# Patient Record
Sex: Male | Born: 1945 | Race: White | Hispanic: No | Marital: Married | Smoking: Former smoker
Health system: Southern US, Community
[De-identification: ages and names within clinical notes are randomized; demographics above are authoritative.]

## PROBLEM LIST (undated history)

## (undated) DIAGNOSIS — T07XXXA Unspecified multiple injuries, initial encounter: Secondary | ICD-10-CM

## (undated) DIAGNOSIS — M419 Scoliosis, unspecified: Secondary | ICD-10-CM

## (undated) HISTORY — PX: FOOT SURGERY: SHX648

---

## 2016-07-28 DIAGNOSIS — Z23 Encounter for immunization: Secondary | ICD-10-CM | POA: Diagnosis not present

## 2017-01-28 DIAGNOSIS — H524 Presbyopia: Secondary | ICD-10-CM | POA: Diagnosis not present

## 2017-07-22 DIAGNOSIS — Z23 Encounter for immunization: Secondary | ICD-10-CM | POA: Diagnosis not present

## 2018-01-16 DIAGNOSIS — J209 Acute bronchitis, unspecified: Secondary | ICD-10-CM | POA: Diagnosis not present

## 2018-01-16 DIAGNOSIS — J189 Pneumonia, unspecified organism: Secondary | ICD-10-CM | POA: Diagnosis not present

## 2018-01-16 DIAGNOSIS — R05 Cough: Secondary | ICD-10-CM | POA: Diagnosis not present

## 2018-01-16 DIAGNOSIS — R0989 Other specified symptoms and signs involving the circulatory and respiratory systems: Secondary | ICD-10-CM | POA: Diagnosis not present

## 2020-05-13 ENCOUNTER — Other Ambulatory Visit: Payer: Self-pay

## 2020-05-13 ENCOUNTER — Encounter (HOSPITAL_BASED_OUTPATIENT_CLINIC_OR_DEPARTMENT_OTHER): Payer: Self-pay | Admitting: Emergency Medicine

## 2020-05-13 ENCOUNTER — Emergency Department (HOSPITAL_BASED_OUTPATIENT_CLINIC_OR_DEPARTMENT_OTHER): Payer: Medicare Other

## 2020-05-13 ENCOUNTER — Inpatient Hospital Stay (HOSPITAL_BASED_OUTPATIENT_CLINIC_OR_DEPARTMENT_OTHER)
Admission: EM | Admit: 2020-05-13 | Discharge: 2020-05-15 | DRG: 482 | Disposition: A | Discharge status: Home / Self Care | Payer: Medicare Other | Attending: Specialist | Admitting: Specialist

## 2020-05-13 DIAGNOSIS — S72009A Fracture of unspecified part of neck of unspecified femur, initial encounter for closed fracture: Secondary | ICD-10-CM | POA: Diagnosis present

## 2020-05-13 DIAGNOSIS — S72001A Fracture of unspecified part of neck of right femur, initial encounter for closed fracture: Secondary | ICD-10-CM

## 2020-05-13 DIAGNOSIS — S72031A Displaced midcervical fracture of right femur, initial encounter for closed fracture: Secondary | ICD-10-CM | POA: Diagnosis not present

## 2020-05-13 DIAGNOSIS — W19XXXA Unspecified fall, initial encounter: Secondary | ICD-10-CM

## 2020-05-13 DIAGNOSIS — M1711 Unilateral primary osteoarthritis, right knee: Secondary | ICD-10-CM | POA: Diagnosis present

## 2020-05-13 DIAGNOSIS — W010XXA Fall on same level from slipping, tripping and stumbling without subsequent striking against object, initial encounter: Secondary | ICD-10-CM | POA: Diagnosis present

## 2020-05-13 DIAGNOSIS — Z20822 Contact with and (suspected) exposure to covid-19: Secondary | ICD-10-CM | POA: Diagnosis present

## 2020-05-13 DIAGNOSIS — Z01818 Encounter for other preprocedural examination: Secondary | ICD-10-CM

## 2020-05-13 DIAGNOSIS — Z809 Family history of malignant neoplasm, unspecified: Secondary | ICD-10-CM

## 2020-05-13 DIAGNOSIS — Z419 Encounter for procedure for purposes other than remedying health state, unspecified: Secondary | ICD-10-CM

## 2020-05-13 DIAGNOSIS — M419 Scoliosis, unspecified: Secondary | ICD-10-CM | POA: Diagnosis present

## 2020-05-13 DIAGNOSIS — Q6691 Congenital deformity of feet, unspecified, right foot: Secondary | ICD-10-CM

## 2020-05-13 DIAGNOSIS — Q6689 Other  specified congenital deformities of feet: Secondary | ICD-10-CM

## 2020-05-13 DIAGNOSIS — Y92096 Garden or yard of other non-institutional residence as the place of occurrence of the external cause: Secondary | ICD-10-CM

## 2020-05-13 DIAGNOSIS — Z87891 Personal history of nicotine dependence: Secondary | ICD-10-CM

## 2020-05-13 DIAGNOSIS — E559 Vitamin D deficiency, unspecified: Secondary | ICD-10-CM | POA: Diagnosis present

## 2020-05-13 HISTORY — DX: Unspecified multiple injuries, initial encounter: T07.XXXA

## 2020-05-13 HISTORY — DX: Scoliosis, unspecified: M41.9

## 2020-05-13 LAB — CBC WITH DIFFERENTIAL/PLATELET
Abs Immature Granulocytes: 0.04 10*3/uL (ref 0.00–0.07)
Basophils Absolute: 0 10*3/uL (ref 0.0–0.1)
Basophils Relative: 0 %
Eosinophils Absolute: 0 10*3/uL (ref 0.0–0.5)
Eosinophils Relative: 0 %
HCT: 48.1 % (ref 39.0–52.0)
Hemoglobin: 15.5 g/dL (ref 13.0–17.0)
Immature Granulocytes: 0 %
Lymphocytes Relative: 8 %
Lymphs Abs: 0.9 10*3/uL (ref 0.7–4.0)
MCH: 26.9 pg (ref 26.0–34.0)
MCHC: 32.2 g/dL (ref 30.0–36.0)
MCV: 83.4 fL (ref 80.0–100.0)
Monocytes Absolute: 1 10*3/uL (ref 0.1–1.0)
Monocytes Relative: 9 %
Neutro Abs: 9.4 10*3/uL — ABNORMAL HIGH (ref 1.7–7.7)
Neutrophils Relative %: 83 %
Platelets: 175 10*3/uL (ref 150–400)
RBC: 5.77 MIL/uL (ref 4.22–5.81)
RDW: 13.6 % (ref 11.5–15.5)
WBC: 11.5 10*3/uL — ABNORMAL HIGH (ref 4.0–10.5)
nRBC: 0 % (ref 0.0–0.2)

## 2020-05-13 MED ORDER — HYDROMORPHONE HCL 1 MG/ML IJ SOLN: 0.5000 mg | INTRAMUSCULAR | Status: DC | PRN

## 2020-05-13 MED ORDER — ONDANSETRON HCL 4 MG/2ML IJ SOLN
4.0000 mg | Freq: Once | INTRAMUSCULAR | Status: AC
  Administered 2020-05-13: 4 mg via INTRAVENOUS
  Filled 2020-05-13: qty 2

## 2020-05-13 MED ORDER — MORPHINE SULFATE (PF) 4 MG/ML IV SOLN
4.0000 mg | Freq: Once | INTRAVENOUS | Status: AC
  Administered 2020-05-13: 4 mg via INTRAVENOUS
  Filled 2020-05-13: qty 1

## 2020-05-13 NOTE — ED Triage Notes (Signed)
Pt states he was out in the yard and tripped over a root and fell  Pt is c/o right hip pain

## 2020-05-13 NOTE — ED Provider Notes (Signed)
[facility] EMERGENCY DEPARTMENT Provider Note   CSN: [id number] Arrival date & time: 05/13/20  1922     History Chief Complaint  Patient presents with  . Fall  . Hip Pain    Richard Todd is a 74 y.o. male.  HPI     This is a 74 year old male who presents with right hip pain.  Patient reports she tripped and fell on a vine in his yard.  His neighbor is an orthopedist who examined him and felt like he needed x-rays.  He states that his pain is controlled when he is not moving but he has significant pain with movement.  He was able to bear some weight but with significant pain.  Denies hitting his head or loss of consciousness.  He is not on any blood thinners.  He denies any major medical problems.  No recent Covid exposures and he is fully vaccinated.  Currently he rates his pain at 2 out of 10.  Dr. Otelia Sergeant has agreed to take care of the patient if indeed he has a fracture.  History reviewed. No pertinent past medical history.  Patient Active Problem List   Diagnosis Date Noted  . Hip fracture (HCC) 05/13/2020    Past Surgical History:  Procedure Laterality Date  . FOOT SURGERY         Family History  Problem Relation Age of Onset  . Cancer Father     Social History   Tobacco Use  . Smoking status: Former Games developer  . Smokeless tobacco: Never Used  Vaping Use  . Vaping Use: Never used  Substance Use Topics  . Alcohol use: Yes    Comment: social  . Drug use: Never    Home Medications Prior to Admission medications   Not on File    Allergies    Patient has no known allergies.  Review of Systems   Review of Systems  Constitutional: Negative for fever.  Respiratory: Negative for shortness of breath.   Cardiovascular: Negative for chest pain.  Musculoskeletal:       Right hip pain  Neurological: Negative for weakness and numbness.  All other systems reviewed and are negative.   Physical Exam Updated Vital Signs BP (!) 151/72   Pulse 74    Temp 98.3 F (36.8 C) (Oral)   Resp (!) 21   Ht 1.829 m (6')   Wt 83 kg   SpO2 97%   BMI 24.82 kg/m   Physical Exam Vitals and nursing note reviewed.  Constitutional:      Appearance: He is well-developed. He is not diaphoretic.  HENT:     Head: Normocephalic and atraumatic.     Mouth/Throat:     Mouth: Mucous membranes are moist.  Eyes:     Pupils: Pupils are equal, round, and reactive to light.  Cardiovascular:     Rate and Rhythm: Normal rate and regular rhythm.     Heart sounds: Normal heart sounds. No murmur heard.   Pulmonary:     Effort: Pulmonary effort is normal. No respiratory distress.     Breath sounds: Normal breath sounds. No wheezing.  Abdominal:     General: Bowel sounds are normal.     Palpations: Abdomen is soft.     Tenderness: There is no abdominal tenderness. There is no rebound.  Musculoskeletal:        General: Tenderness present. No deformity.     Cervical back: Neck supple.     Right lower leg: No edema.  Left lower leg: No edema.     Comments: Tenderness palpation over the right hip and greater trochanter, no obvious deformity, no foreshortening, 2+ DP pulse bilaterally  Skin:    General: Skin is warm and dry.  Neurological:     Mental Status: He is alert and oriented to person, place, and time.  Psychiatric:        Mood and Affect: Mood normal.     ED Results / Procedures / Treatments   Labs (all labs ordered are listed, but only abnormal results are displayed) Labs Reviewed  CBC WITH DIFFERENTIAL/PLATELET - Abnormal; Notable for the following components:      Result Value   WBC 11.5 (*)    Neutro Abs 9.4 (*)    All other components within normal limits  SARS CORONAVIRUS 2 BY RT PCR (HOSPITAL ORDER, PERFORMED IN [facility])  BASIC METABOLIC PANEL    EKG None  Radiology DG HIP UNILAT WITH PELVIS 2-3 VIEWS RIGHT  Result Date: 05/13/2020 CLINICAL DATA:  Fall, right hip pain EXAM: DG HIP (WITH OR WITHOUT PELVIS)  2-3V RIGHT COMPARISON:  None. FINDINGS: Single view radiograph of the pelvis and two view radiograph of the right hip demonstrates an acute transcervical femoral neck fracture with mild impaction and external rotation of the femoral head with acetabulum. The femoral head remains seated within the acetabulum. There is mild bilateral degenerative hip disease with joint space pelvis and sacrum appear intact IMPRESSION: Acute, impacted transcervical right femoral neck fracture. Femoral head is seated within the acetabulum but demonstrates mild external rotation. Electronically Signed   By: Helyn Numbers MD   On: 05/13/2020 20:18    Procedures Procedures (including critical care time)  Medications Ordered in ED Medications  HYDROmorphone (DILAUDID) injection 0.5 mg (has no administration in time range)  morphine 4 MG/ML injection 4 mg (4 mg Intravenous Given 05/13/20 2338)  ondansetron (ZOFRAN) injection 4 mg (4 mg Intravenous Given 05/13/20 2337)    ED Course  I have reviewed the triage vital signs and the nursing notes.  Pertinent labs & imaging results that were available during my care of the patient were reviewed by me and considered in my medical decision making (see chart for details).    MDM Rules/Calculators/A&P                          Patient presents with injury to the right hip.  Reports mechanical fall.  He is overall nontoxic and vital signs are reassuring.  He has tenderness over the hip without obvious deformity.  He is neurovascularly intact.  X-rays reviewed by myself and show a cervical neck fracture that is impacted.  I spoke with Dr. Otelia Sergeant on the phone who is agreed to take care of the patient and request that I place a bed request [facility].  Screening labs, EKG, Covid testing were obtained.  As needed pain medication was ordered.   Final Clinical Impression(s) / ED Diagnoses Final diagnoses:  Closed fracture of right hip, initial encounter [facility])    Rx / DC  Orders ED Discharge Orders    None       Kha Hari, Mayer Masker, MD 05/14/20 0003

## 2020-05-13 NOTE — ED Notes (Signed)
Equal bilateral pedal pulses found and marked with sharpie.

## 2020-05-14 ENCOUNTER — Encounter (HOSPITAL_BASED_OUTPATIENT_CLINIC_OR_DEPARTMENT_OTHER): Payer: Self-pay | Admitting: Specialist

## 2020-05-14 ENCOUNTER — Inpatient Hospital Stay (HOSPITAL_COMMUNITY): Payer: Medicare Other

## 2020-05-14 ENCOUNTER — Inpatient Hospital Stay (HOSPITAL_COMMUNITY): Payer: Medicare Other | Admitting: Certified Registered Nurse Anesthetist

## 2020-05-14 ENCOUNTER — Encounter (HOSPITAL_COMMUNITY)
Admission: EM | Disposition: A | Discharge status: Home / Self Care | Payer: Self-pay | Source: Home / Self Care | Attending: Specialist

## 2020-05-14 DIAGNOSIS — Z20822 Contact with and (suspected) exposure to covid-19: Secondary | ICD-10-CM | POA: Diagnosis present

## 2020-05-14 DIAGNOSIS — Z809 Family history of malignant neoplasm, unspecified: Secondary | ICD-10-CM | POA: Diagnosis not present

## 2020-05-14 DIAGNOSIS — W010XXA Fall on same level from slipping, tripping and stumbling without subsequent striking against object, initial encounter: Secondary | ICD-10-CM | POA: Diagnosis present

## 2020-05-14 DIAGNOSIS — M419 Scoliosis, unspecified: Secondary | ICD-10-CM | POA: Diagnosis present

## 2020-05-14 DIAGNOSIS — M1711 Unilateral primary osteoarthritis, right knee: Secondary | ICD-10-CM | POA: Diagnosis present

## 2020-05-14 DIAGNOSIS — Q6689 Other  specified congenital deformities of feet: Secondary | ICD-10-CM | POA: Diagnosis not present

## 2020-05-14 DIAGNOSIS — S72001A Fracture of unspecified part of neck of right femur, initial encounter for closed fracture: Secondary | ICD-10-CM | POA: Diagnosis not present

## 2020-05-14 DIAGNOSIS — E559 Vitamin D deficiency, unspecified: Secondary | ICD-10-CM | POA: Diagnosis present

## 2020-05-14 DIAGNOSIS — S72009A Fracture of unspecified part of neck of unspecified femur, initial encounter for closed fracture: Secondary | ICD-10-CM | POA: Diagnosis present

## 2020-05-14 DIAGNOSIS — S72031A Displaced midcervical fracture of right femur, initial encounter for closed fracture: Secondary | ICD-10-CM | POA: Diagnosis present

## 2020-05-14 DIAGNOSIS — Q6691 Congenital deformity of feet, unspecified, right foot: Secondary | ICD-10-CM | POA: Diagnosis not present

## 2020-05-14 DIAGNOSIS — Y92096 Garden or yard of other non-institutional residence as the place of occurrence of the external cause: Secondary | ICD-10-CM | POA: Diagnosis not present

## 2020-05-14 DIAGNOSIS — Z87891 Personal history of nicotine dependence: Secondary | ICD-10-CM | POA: Diagnosis not present

## 2020-05-14 HISTORY — PX: HIP PINNING,CANNULATED: SHX1758

## 2020-05-14 LAB — BASIC METABOLIC PANEL
Anion gap: 12 (ref 5–15)
BUN: 14 mg/dL (ref 8–23)
CO2: 24 mmol/L (ref 22–32)
Calcium: 9.1 mg/dL (ref 8.9–10.3)
Chloride: 102 mmol/L (ref 98–111)
Creatinine, Ser: 0.61 mg/dL (ref 0.61–1.24)
GFR calc Af Amer: >60 mL/min (ref >60)
GFR calc non Af Amer: >60 mL/min (ref >60)
Glucose, Bld: 105 mg/dL — ABNORMAL HIGH (ref 70–99)
Potassium: 3.7 mmol/L (ref 3.5–5.1)
Sodium: 138 mmol/L (ref 135–145)

## 2020-05-14 LAB — MRSA PCR SCREENING: MRSA by PCR: NEGATIVE

## 2020-05-14 LAB — SARS CORONAVIRUS 2 BY RT PCR (HOSPITAL ORDER, PERFORMED IN ~~LOC~~ HOSPITAL LAB): SARS Coronavirus 2: NEGATIVE

## 2020-05-14 LAB — VITAMIN D 25 HYDROXY (VIT D DEFICIENCY, FRACTURES): Vit D, 25-Hydroxy: 23.71 ng/mL — ABNORMAL LOW (ref 30–100)

## 2020-05-14 SURGERY — FIXATION, FEMUR, NECK, PERCUTANEOUS, USING SCREW
Anesthesia: General | Site: Hip | Laterality: Right

## 2020-05-14 MED ORDER — ONDANSETRON HCL 4 MG/2ML IJ SOLN
INTRAMUSCULAR | Status: AC
  Filled 2020-05-14: qty 2

## 2020-05-14 MED ORDER — ONDANSETRON HCL 4 MG/2ML IJ SOLN: 4.0000 mg | Freq: Four times a day (QID) | INTRAMUSCULAR | Status: DC | PRN

## 2020-05-14 MED ORDER — MENTHOL 3 MG MT LOZG: 1.0000 | LOZENGE | OROMUCOSAL | Status: DC | PRN

## 2020-05-14 MED ORDER — METOCLOPRAMIDE HCL 5 MG PO TABS: 5.0000 mg | ORAL_TABLET | Freq: Three times a day (TID) | ORAL | Status: DC | PRN

## 2020-05-14 MED ORDER — ASPIRIN EC 325 MG PO TBEC: 325.0000 mg | DELAYED_RELEASE_TABLET | Freq: Every day | ORAL | Status: DC

## 2020-05-14 MED ORDER — POVIDONE-IODINE 10 % EX SWAB
2.0000 "application " | Freq: Once | CUTANEOUS | Status: AC
  Administered 2020-05-14: 2 via TOPICAL

## 2020-05-14 MED ORDER — METHOCARBAMOL 1000 MG/10ML IJ SOLN
500.0000 mg | Freq: Four times a day (QID) | INTRAVENOUS | Status: DC | PRN
  Filled 2020-05-14: qty 5

## 2020-05-14 MED ORDER — POLYVINYL ALCOHOL 1.4 % OP SOLN
1.0000 [drp] | OPHTHALMIC | Status: DC | PRN
  Filled 2020-05-14: qty 15

## 2020-05-14 MED ORDER — ASPIRIN EC 325 MG PO TBEC
325.0000 mg | DELAYED_RELEASE_TABLET | Freq: Every day | ORAL | Status: DC
  Administered 2020-05-15: 325 mg via ORAL
  Filled 2020-05-14: qty 1

## 2020-05-14 MED ORDER — SODIUM CHLORIDE 0.9 % IV SOLN: INTRAVENOUS | Status: DC

## 2020-05-14 MED ORDER — BUPIVACAINE HCL (PF) 0.5 % IJ SOLN
INTRAMUSCULAR | Status: AC
  Filled 2020-05-14: qty 30

## 2020-05-14 MED ORDER — PROPOFOL 10 MG/ML IV BOLUS
INTRAVENOUS | Status: DC | PRN
  Administered 2020-05-14: 30 mg via INTRAVENOUS
  Administered 2020-05-14: 120 mg via INTRAVENOUS

## 2020-05-14 MED ORDER — METHOCARBAMOL 500 MG PO TABS
500.0000 mg | ORAL_TABLET | Freq: Four times a day (QID) | ORAL | Status: DC | PRN
  Administered 2020-05-14: 500 mg via ORAL
  Filled 2020-05-14: qty 1

## 2020-05-14 MED ORDER — BISACODYL 5 MG PO TBEC: 5.0000 mg | DELAYED_RELEASE_TABLET | Freq: Every day | ORAL | Status: DC | PRN

## 2020-05-14 MED ORDER — ASCORBIC ACID 500 MG PO TABS
500.0000 mg | ORAL_TABLET | Freq: Every day | ORAL | Status: DC
  Administered 2020-05-14 – 2020-05-15 (×2): 500 mg via ORAL
  Filled 2020-05-14 (×2): qty 1

## 2020-05-14 MED ORDER — SODIUM CHLORIDE 0.9 % IR SOLN
Status: DC | PRN
  Administered 2020-05-14: 1000 mL

## 2020-05-14 MED ORDER — ROCURONIUM BROMIDE 10 MG/ML (PF) SYRINGE
PREFILLED_SYRINGE | INTRAVENOUS | Status: AC
  Filled 2020-05-14: qty 10

## 2020-05-14 MED ORDER — ROCURONIUM BROMIDE 10 MG/ML (PF) SYRINGE
PREFILLED_SYRINGE | INTRAVENOUS | Status: DC | PRN
  Administered 2020-05-14: 5 mg via INTRAVENOUS
  Administered 2020-05-14: 80 mg via INTRAVENOUS

## 2020-05-14 MED ORDER — ACETAMINOPHEN 325 MG PO TABS: 325.0000 mg | ORAL_TABLET | Freq: Four times a day (QID) | ORAL | Status: DC | PRN

## 2020-05-14 MED ORDER — FENTANYL CITRATE (PF) 100 MCG/2ML IJ SOLN
INTRAMUSCULAR | Status: AC
  Filled 2020-05-14: qty 2

## 2020-05-14 MED ORDER — DEXAMETHASONE SODIUM PHOSPHATE 10 MG/ML IJ SOLN
INTRAMUSCULAR | Status: DC | PRN
  Administered 2020-05-14: 10 mg via INTRAVENOUS

## 2020-05-14 MED ORDER — HYDROCODONE-ACETAMINOPHEN 5-325 MG PO TABS: 1.0000 | ORAL_TABLET | Freq: Four times a day (QID) | ORAL | Status: DC | PRN

## 2020-05-14 MED ORDER — DEXAMETHASONE SODIUM PHOSPHATE 10 MG/ML IJ SOLN
INTRAMUSCULAR | Status: AC
  Filled 2020-05-14: qty 1

## 2020-05-14 MED ORDER — MAGNESIUM CITRATE PO SOLN: 1.0000 | Freq: Once | ORAL | Status: DC | PRN

## 2020-05-14 MED ORDER — EPHEDRINE SULFATE-NACL 50-0.9 MG/10ML-% IV SOSY
PREFILLED_SYRINGE | INTRAVENOUS | Status: DC | PRN
  Administered 2020-05-14: 5 mg via INTRAVENOUS

## 2020-05-14 MED ORDER — LIDOCAINE 2% (20 MG/ML) 5 ML SYRINGE
INTRAMUSCULAR | Status: DC | PRN
  Administered 2020-05-14: 100 mg via INTRAVENOUS

## 2020-05-14 MED ORDER — CHLORHEXIDINE GLUCONATE 4 % EX LIQD: 60.0000 mL | Freq: Once | CUTANEOUS | Status: DC

## 2020-05-14 MED ORDER — PHENYLEPHRINE HCL-NACL 10-0.9 MG/250ML-% IV SOLN
INTRAVENOUS | Status: DC | PRN
Start: 2020-05-14 — End: 2020-05-14
  Administered 2020-05-14: 25 ug/min via INTRAVENOUS

## 2020-05-14 MED ORDER — TRAMADOL HCL 50 MG PO TABS
50.0000 mg | ORAL_TABLET | Freq: Four times a day (QID) | ORAL | Status: DC
  Administered 2020-05-15: 50 mg via ORAL
  Filled 2020-05-14: qty 1

## 2020-05-14 MED ORDER — PROPOFOL 10 MG/ML IV BOLUS
INTRAVENOUS | Status: AC
  Filled 2020-05-14: qty 20

## 2020-05-14 MED ORDER — DOCUSATE SODIUM 100 MG PO CAPS
100.0000 mg | ORAL_CAPSULE | Freq: Two times a day (BID) | ORAL | Status: DC
  Administered 2020-05-14 – 2020-05-15 (×2): 100 mg via ORAL
  Filled 2020-05-14 (×2): qty 1

## 2020-05-14 MED ORDER — BUPIVACAINE LIPOSOME 1.3 % IJ SUSP
10.0000 mL | Freq: Once | INTRAMUSCULAR | Status: AC
  Administered 2020-05-14: 4 mL
  Filled 2020-05-14: qty 10

## 2020-05-14 MED ORDER — METOCLOPRAMIDE HCL 5 MG/ML IJ SOLN: 5.0000 mg | Freq: Three times a day (TID) | INTRAMUSCULAR | Status: DC | PRN

## 2020-05-14 MED ORDER — DOCUSATE SODIUM 100 MG PO CAPS: 100.0000 mg | ORAL_CAPSULE | Freq: Two times a day (BID) | ORAL | Status: DC

## 2020-05-14 MED ORDER — BUPIVACAINE HCL (PF) 0.5 % IJ SOLN
INTRAMUSCULAR | Status: DC | PRN
  Administered 2020-05-14: 4 mL

## 2020-05-14 MED ORDER — MORPHINE SULFATE (PF) 2 MG/ML IV SOLN: 0.5000 mg | INTRAVENOUS | Status: DC | PRN

## 2020-05-14 MED ORDER — ONDANSETRON HCL 4 MG/2ML IJ SOLN: 4.0000 mg | Freq: Once | INTRAMUSCULAR | Status: DC | PRN

## 2020-05-14 MED ORDER — FENTANYL CITRATE (PF) 100 MCG/2ML IJ SOLN
INTRAMUSCULAR | Status: DC | PRN
  Administered 2020-05-14 (×3): 50 ug via INTRAVENOUS
  Administered 2020-05-14: 100 ug via INTRAVENOUS

## 2020-05-14 MED ORDER — MEPERIDINE HCL 50 MG/ML IJ SOLN: 6.2500 mg | INTRAMUSCULAR | Status: DC | PRN

## 2020-05-14 MED ORDER — METHOCARBAMOL 500 MG PO TABS: 500.0000 mg | ORAL_TABLET | Freq: Four times a day (QID) | ORAL | Status: DC | PRN

## 2020-05-14 MED ORDER — HYDROCODONE-ACETAMINOPHEN 5-325 MG PO TABS
1.0000 | ORAL_TABLET | ORAL | Status: DC | PRN
  Administered 2020-05-14: 2 via ORAL
  Filled 2020-05-14: qty 2

## 2020-05-14 MED ORDER — POLYETHYLENE GLYCOL 3350 17 G PO PACK: 17.0000 g | PACK | Freq: Every day | ORAL | Status: DC | PRN

## 2020-05-14 MED ORDER — HYDROCODONE-ACETAMINOPHEN 7.5-325 MG PO TABS
1.0000 | ORAL_TABLET | ORAL | Status: DC | PRN
  Administered 2020-05-14 – 2020-05-15 (×2): 1 via ORAL
  Filled 2020-05-14 (×2): qty 1

## 2020-05-14 MED ORDER — ZOLPIDEM TARTRATE 5 MG PO TABS: 5.0000 mg | ORAL_TABLET | Freq: Every evening | ORAL | Status: DC | PRN

## 2020-05-14 MED ORDER — SUGAMMADEX SODIUM 200 MG/2ML IV SOLN
INTRAVENOUS | Status: DC | PRN
  Administered 2020-05-14: 200 mg via INTRAVENOUS

## 2020-05-14 MED ORDER — LACTATED RINGERS IV SOLN: INTRAVENOUS | Status: DC | PRN

## 2020-05-14 MED ORDER — HYDROMORPHONE HCL 1 MG/ML IJ SOLN: 0.2500 mg | INTRAMUSCULAR | Status: DC | PRN

## 2020-05-14 MED ORDER — CEFAZOLIN SODIUM-DEXTROSE 2-4 GM/100ML-% IV SOLN
2.0000 g | INTRAVENOUS | Status: AC
  Administered 2020-05-14: 2 g via INTRAVENOUS
  Filled 2020-05-14: qty 100

## 2020-05-14 MED ORDER — LIDOCAINE 2% (20 MG/ML) 5 ML SYRINGE
INTRAMUSCULAR | Status: AC
  Filled 2020-05-14: qty 5

## 2020-05-14 MED ORDER — ADULT MULTIVITAMIN W/MINERALS CH
1.0000 | ORAL_TABLET | Freq: Every day | ORAL | Status: DC
  Administered 2020-05-14 – 2020-05-15 (×2): 1 via ORAL
  Filled 2020-05-14 (×2): qty 1

## 2020-05-14 MED ORDER — PHENOL 1.4 % MT LIQD: 1.0000 | OROMUCOSAL | Status: DC | PRN

## 2020-05-14 MED ORDER — ENSURE PRE-SURGERY PO LIQD
296.0000 mL | Freq: Once | ORAL | Status: AC
  Administered 2020-05-14: 296 mL via ORAL
  Filled 2020-05-14: qty 296

## 2020-05-14 MED ORDER — ONDANSETRON HCL 4 MG/2ML IJ SOLN
INTRAMUSCULAR | Status: DC | PRN
  Administered 2020-05-14: 4 mg via INTRAVENOUS

## 2020-05-14 MED ORDER — ONDANSETRON HCL 4 MG PO TABS: 4.0000 mg | ORAL_TABLET | Freq: Four times a day (QID) | ORAL | Status: DC | PRN

## 2020-05-14 SURGICAL SUPPLY — 42 items
BAG DECANTER FOR FLEXI CONT (MISCELLANEOUS) ×2 IMPLANT
BAG ZIPLOCK 12X15 (MISCELLANEOUS) ×2 IMPLANT
BIT DRILL 4.8X300 (BIT) ×2 IMPLANT
BNDG GAUZE ELAST 4 BULKY (GAUZE/BANDAGES/DRESSINGS) ×2 IMPLANT
COVER SURGICAL LIGHT HANDLE (MISCELLANEOUS) ×2 IMPLANT
COVER WAND RF STERILE (DRAPES) IMPLANT
DERMABOND ADVANCED (GAUZE/BANDAGES/DRESSINGS) ×1
DERMABOND ADVANCED .7 DNX12 (GAUZE/BANDAGES/DRESSINGS) ×1 IMPLANT
DRAPE STERI IOBAN 125X83 (DRAPES) ×2 IMPLANT
DRSG MEPILEX BORDER 4X8 (GAUZE/BANDAGES/DRESSINGS) ×2 IMPLANT
DRSG PAD ABDOMINAL 8X10 ST (GAUZE/BANDAGES/DRESSINGS) ×2 IMPLANT
DURAPREP 26ML APPLICATOR (WOUND CARE) ×2 IMPLANT
ELECT REM PT RETURN 15FT ADLT (MISCELLANEOUS) ×2 IMPLANT
EVACUATOR 1/8 PVC DRAIN (DRAIN) IMPLANT
EVACUATOR SILICONE 100CC (DRAIN) IMPLANT
GAUZE SPONGE 4X4 12PLY STRL (GAUZE/BANDAGES/DRESSINGS) ×2 IMPLANT
GLOVE ECLIPSE 8.5 STRL (GLOVE) ×4 IMPLANT
GOWN STRL REUS W/TWL XL LVL3 (GOWN DISPOSABLE) ×2 IMPLANT
KIT BASIN OR (CUSTOM PROCEDURE TRAY) ×2 IMPLANT
KIT TURNOVER KIT A (KITS) IMPLANT
MANIFOLD NEPTUNE II (INSTRUMENTS) ×2 IMPLANT
NEEDLE HYPO 25X1 1.5 SAFETY (NEEDLE) ×2 IMPLANT
NS IRRIG 1000ML POUR BTL (IV SOLUTION) ×2 IMPLANT
PACK GENERAL/GYN (CUSTOM PROCEDURE TRAY) ×2 IMPLANT
PAD CAST 4YDX4 CTTN HI CHSV (CAST SUPPLIES) ×1 IMPLANT
PADDING CAST COTTON 4X4 STRL (CAST SUPPLIES) ×1
PENCIL SMOKE EVACUATOR (MISCELLANEOUS) IMPLANT
PIN GUIDE DRILL TIP 2.8X300 (DRILL) ×8 IMPLANT
PROTECTOR NERVE ULNAR (MISCELLANEOUS) ×2 IMPLANT
SCREW 16MM THREAD 6.5X85MM (Screw) ×2 IMPLANT
SCREW CANN 6.5X90 (Screw) ×1 IMPLANT
SCREW CANN 6.5X90 16MM THD (Screw) ×1 IMPLANT
SCREW CANNULATED 6.5X90MM (Screw) ×1 IMPLANT
SCREW CANNULATED 6.5X95 HIP (Screw) ×6 IMPLANT
STAPLER VISISTAT 35W (STAPLE) ×2 IMPLANT
SUT VIC AB 0 CT1 27 (SUTURE) ×2
SUT VIC AB 0 CT1 27XBRD ANTBC (SUTURE) ×2 IMPLANT
SUT VIC AB 2-0 CT1 27 (SUTURE) ×1
SUT VIC AB 2-0 CT1 27XBRD (SUTURE) ×1 IMPLANT
SYR CONTROL 10ML LL (SYRINGE) IMPLANT
TOWEL OR 17X26 10 PK STRL BLUE (TOWEL DISPOSABLE) ×4 IMPLANT
WATER STERILE IRR 1000ML POUR (IV SOLUTION) ×2 IMPLANT

## 2020-05-14 NOTE — Progress Notes (Signed)
Surgical consent signed by pt. Pt denied any questions or concerns.

## 2020-05-14 NOTE — Op Note (Addendum)
05/14/2020  3:48 PM  PATIENT:  Richard Todd  74 y.o. male  MRN: [id number]  OPERATIVE REPORT  PRE-OPERATIVE DIAGNOSIS:  Right Closed Impacted Femoral neck fracture  POST-OPERATIVE DIAGNOSIS:  Right Closed Impacted Femoral neck fracture  PROCEDURE:  Procedure(s): CLOSED REDUCTION AND CANNULATED PINNING RIGHT VALGUS IMPACTED FEMORAL NECK FRACTURE.    SURGEON:  Jessy Oto, MD        ANESTHESIA:  General, supplemented with local anesthetic marcaine 0.5% 1:1  exparel 1.3% total 20cc. Dr. Lillia Abed.    COMPLICATIONS:  None.  EBL 100 CC     COMPONENTS:   Implant Name Type Inv. Item Serial No. Manufacturer Lot No. LRB No. Used Action  SCREW 16MM THREAD 6.5X85MM - [id number] Screw SCREW 16MM THREAD 6.5X85MM  [employer](ORTH,TRAU,BIO,SG)  Right 1 Implanted  SCREW CANNULATED 6.5X90MM - [id number] Screw SCREW CANNULATED 6.5X90MM  [employer](ORTH,TRAU,BIO,SG)  Right 1 Implanted  SCREW CANNULATED 6.5X95 HIP - [id number] Screw SCREW CANNULATED 6.5X95 HIP  [employer](ORTH,TRAU,BIO,SG)  Right 1 Implanted and Explanted  SCREW CANNULATED 6.5X95 HIP - [id number] Screw SCREW CANNULATED 6.5X95 HIP  [employer](ORTH,TRAU,BIO,SG)  Right 2 Implanted    PROCEDURE:  The patient was met in the holding area, and the appropriate right hip identified and marked with an "X" and my initials.  The patient was then transported to OR and was placed on the operative Hanna fracture table in a supine position. The patient was then placed under general anesthesia without difficulty. The patient received appropriate preoperative antibiotic prophylaxis of Ancef 2 g. Right  leg was placed in a well leg holder. Left leg placed in a left foot boot, but no significant longitudinal traction was applied and the foot in approximately 15 of internal rotation. left groin post was used. Evaluation of the femoral neck fracture demonstrated an impacted valgus transcervical hip fracture. The fracture was not able to be  reducted to normal varus valgus alignment with hip adduction and internal rotation so that the right foot boot was removed from the holder and the right hip placed through a gentle Leadbetter maneuver flexing the hip then internally rotating and returning the hip to neutral flexion extension. Repeat fluoro demonstrated the right femoral neck fracture reduced to normal length with reduction of the valgus position to neutral varus valgus alignment. The right foot and leg holding boot replaced into the longitudinal holder, no distraction or traction was applied to the right leg. The right hip in 15 degrees internal rotation and neutral abduction adduction. The leg was then prepped using sterile conditions and draped using sterile technique. An iodine exclusion Vi-Drape was used. Time-out procedure was called and correct .  C-arm fluoroscopy was then brought into the field. Under C-arm fluoroscopy left hip was examined and the site for incision marked with Kelly clamp. Incision was made along the lateral aspect of the femur in line with the proximal femur just distal to the greater trochanter.  Incision approximately 3 inches in length. Subcutaneous layers then incised to the ITB. Incision was then carried in line with the iliotibial band this layers spread with Weitlander. The vastus lateralis was then incised after retraction anteriorly and blunt dissection used to expose the lateral aspect of the femur at the gradual curve from the distal portion of the trochanter laterally. Multiple guidepins guide was then inserted and the first guide pin placed into the mid portion of the femoral neck and head on AP and lateral views. The first guide pin was placed down to just short  of subchondral bone about 2 or 3 mm on C-arm fluoroscopy in AP lateral planes in good position alignment area using this as a reference then 3 more pins were placed creating an inverted triangle fixation the pins were placed as close to the cortex up  the femoral neck is possible to obtain better purchase. Total of 4 guide pins were placed one superior and in the midportion head on the AP and lateral views respectively. The second placed in the inferior and posterior aspect of the neck was placed in the inferior and anterior aspect of the neck. Each of these were then measured for length the superficial cortex and overdrilled was taken to ensure that each of the guidepins were placed short of subchondral bone and there was no penetration of the joint. The knife then the superior most aspect measured at about 95-100 mm a 95 mm cannulated screw was chosen was then placed over the guidepin and using power placed within a few turns of the lateral cortex then a T-handle was used to seat the screw obtaining excellent purchase.  C-arm fluoroscopy used to check the alignment and position screws appear to be well placed by penetration area next then the second screw was placed in the posterior inferior position measuring length using a 95 mm length screw over drilling the lateral cortex and then placing the screw within 3-4 mm of subchondral bone. The shorter screw length were used ensuring no penetration finding the anterior inferior screw was placed again measuring depth and 95 mm and over drilling the lateral cortex and plantar the cannulated screw within several turned to the lateral cortex and placing in and obtaining excellent purchase on all 3 screws. After tightening the cannulated screws the posterior most screw was close to the subchondral bone and was near the articular surface so that this was exchanged for a cannulated screws that was 5 mm shorter. At this point a fourth screw was placed as the superior portion of the neck and head was free of fixation and with the shortening of the one screw a more stable construct was felt to be present with the additional fourth pin, the four cannulated screw was 6.5 x 85 mm and provided excellent purchase. The guidepins  were removed and permanent documentation in AP and lateral planes obtained and using C-arm fluoroscopy care for rotation of the femur demonstrated all screws to be in good position alignment without evidence of penetration. The soft tissues and subcutaneous areas then infiltrated with marcaine 0.5% 1:1 exparel 1.3% total 20cc.  Following further irrigation and the 3 1/2 inch incision was closed first closing the vastus lateralis with a single interrupted 0 Vicryl suture then the tensor fascia lata with interrupted 0 Vicryl sutures subcutaneous layers with interrupted       2-0 Vicryl and a running subcutaneous stitch of 4-0 Vicryl. Dermabond was applied then MedPlex bandage. Patient was then returned to his bed reactivated extubated and returned to recovery room in satisfactory condition all instrument sponge counts were correct. Note that intraoperative C-arm images were obtained their quality was not sufficient for documentation so that permanent radiographs were obtained in the recovery room         Basil Dess  05/14/2020, 3:48 PM

## 2020-05-14 NOTE — Brief Op Note (Signed)
05/14/2020  3:34 PM  PATIENT:  Richard Todd  74 y.o. male  PRE-OPERATIVE DIAGNOSIS:  Femeoral head fracture  POST-OPERATIVE DIAGNOSIS:  Femeoral head fracture  PROCEDURE:  Procedure(s): CANNULATED HIP PINNING (Right)  SURGEON:  Surgeon(s) and Role:    * Kerrin Champagne, MD - Primary   ANESTHESIA:   general  EBL:  50 mL   BLOOD ADMINISTERED:none  DRAINS: none   LOCAL MEDICATIONS USED:  MARCAINE 0.5% 1:1 EXPAREL 1.3% Amount: 20 ml  SPECIMEN:  No Specimen  DISPOSITION OF SPECIMEN:  N/A  COUNTS:  YES  TOURNIQUET:  * No tourniquets in log *  DICTATION: .Dragon Dictation  PLAN OF CARE: Admit to inpatient   PATIENT DISPOSITION:  PACU - hemodynamically stable.   Delay start of Pharmacological VTE agent (>24hrs) due to surgical blood loss or risk of bleeding: no

## 2020-05-14 NOTE — Assessment & Plan Note (Signed)
Closed impacted valgus right femoral neck fracture. Admit to [facility] for surgical treatment

## 2020-05-14 NOTE — Interval H&P Note (Signed)
History and Physical Interval Note:  05/14/2020 1:11 PM  Richard Todd  has presented today for surgery, with the diagnosis of Femeoral head fracture.  The various methods of treatment have been discussed with the patient and family. After consideration of risks, benefits and other options for treatment, the patient has consented to  Procedure(s): CANNULATED HIP PINNING (Right) as a surgical intervention.  The patient's history has been reviewed, patient examined, no change in status, stable for surgery.  I have reviewed the patient's chart and labs.  Questions were answered to the patient's satisfaction.     Vira Browns

## 2020-05-14 NOTE — Anesthesia Postprocedure Evaluation (Signed)
Anesthesia Post Note  Patient: Brendt Dible  Procedure(s) Performed: CANNULATED HIP PINNING (Right Hip)     Patient location during evaluation: PACU Anesthesia Type: General Level of consciousness: awake and alert Pain management: pain level controlled Vital Signs Assessment: post-procedure vital signs reviewed and stable Respiratory status: spontaneous breathing, nonlabored ventilation, respiratory function stable and patient connected to nasal cannula oxygen Cardiovascular status: blood pressure returned to baseline and stable Postop Assessment: no apparent nausea or vomiting Anesthetic complications: no   No complications documented.  Last Vitals:  Vitals:   05/14/20 1600 05/14/20 1612  BP: 136/69 137/80  Pulse: 65 (!) 59  Resp: 15 18  Temp: 37.2 C 36.8 C  SpO2: 100% 99%    Last Pain:  Vitals:   05/14/20 1612  TempSrc: Oral  PainSc:                  Damire Remedios DAVID

## 2020-05-14 NOTE — Anesthesia Preprocedure Evaluation (Signed)
Anesthesia Evaluation  Patient identified by MRN, date of birth, ID band Patient awake    Reviewed: Allergy & Precautions, NPO status , Patient's Chart, lab work & pertinent test results  Airway Mallampati: I  TM Distance: >3 FB Neck ROM: Full    Dental   Pulmonary former smoker,    Pulmonary exam normal        Cardiovascular Normal cardiovascular exam     Neuro/Psych    GI/Hepatic   Endo/Other    Renal/GU      Musculoskeletal   Abdominal   Peds  Hematology   Anesthesia Other Findings   Reproductive/Obstetrics                             Anesthesia Physical Anesthesia Plan  ASA: II  Anesthesia Plan: General   Post-op Pain Management:    Induction: Intravenous  PONV Risk Score and Plan: Ondansetron and Treatment may vary due to age or medical condition  Airway Management Planned: Oral ETT  Additional Equipment:   Intra-op Plan:   Post-operative Plan: Extubation in OR  Informed Consent: I have reviewed the patients History and Physical, chart, labs and discussed the procedure including the risks, benefits and alternatives for the proposed anesthesia with the patient or authorized representative who has indicated his/her understanding and acceptance.       Plan Discussed with: CRNA and Surgeon  Anesthesia Plan Comments:         Anesthesia Quick Evaluation

## 2020-05-14 NOTE — H&P (Addendum)
PREOPERATIVE H&P  Chief Complaint: Right hip pain following fall.  HPI: Richard Todd is a 74 y.o. male who presents via transfer from [facility] with a diagnosis of right impacted valgus femoral neck fracture. Symptoms are rated as moderate to severe, and have been worsening.  This is significantly impairing activities of daily living.  He has elected for surgical management. Patient seen at [facility] after fall [facility] 05/13/2020 while performing yard work, he tripped on stump in his yard landing on the right hip. Pain and inability to bear weight on the right leg since the fall. Unable to flex the right hip actively. Pain is right buttock into the deep right lateral hip, severe with any range of motion of with weight bearing right hip. 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 History of a remote greater than 20 years ago Rib fracture, right knee degenerative joint disease, scoliosis.  Past Medical History:  Diagnosis Date  . Fractures   . Scoliosis    Past Surgical History:  Procedure Laterality Date  . FOOT SURGERY     Social History   Socioeconomic History  . Marital status: Married    Spouse name: Windell Moulding  . Number of children: 0  . Years of education: 68  . Highest education level: Associate degree: occupational, Scientist, product/process development, or vocational program  Occupational History  . 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 Smoking status: Former Smoker    Packs/day: 1.00    Years: 10.00    Pack years: 10.00    Quit date: 05/14/1977    Years since quitting: 43.0  . Smokeless tobacco: Never Used  Vaping Use  . Vaping Use: Never used  Substance and Sexual Activity  . Alcohol use: Yes    Comment: social  . Drug use: Never  . Sexual activity: Not on file  Other Topics Concern  . Not on file  Social History Narrative  . Not on file   Social Determinants of Health    Financial Resource Strain:   . Difficulty of Paying Living Expenses:   Food Insecurity:   . Worried About Programme researcher, broadcasting/film/video in the Last Year:   . Barista in the Last Year:   Transportation Needs:   . Freight forwarder (Medical):   Marland Kitchen Lack of Transportation (Non-Medical):   Physical Activity:   . Days of Exercise per Week:   . Minutes of Exercise per Session:   Stress:   . Feeling of Stress :   Social Connections:   . Frequency of Communication with Friends and Family:   . Frequency of Social Gatherings with Friends and Family:   . Attends Religious Services:   . Active Member of Clubs or Organizations:   . Attends Banker Meetings:   Marland Kitchen Marital Status:    Family History  Problem Relation Age of Onset  . Cancer Father    No Known Allergies Prior to Admission medications   Not on File     Positive ROS: All other systems have been reviewed and were otherwise negative with the exception of those mentioned in the HPI and as above.  Physical Exam: General: Alert, no acute distress Cardiovascular: No pedal edema Respiratory: No cyanosis, no use of accessory musculature GI: No organomegaly, abdomen is soft and non-tender Skin: No lesions in the area of chief complaint Neurologic: Sensation intact  distally Psychiatric: Patient is competent for consent with normal mood and affect Lymphatic: No axillary or cervical lymphadenopathy  MUSCULOSKELETAL: Right leg is minimally externally rotated, No shortening of the right leg. Right leg is neurovascularly intact. Motor is intact but attended by severe pain with weakness right hip flexion and with any ROM.  Panful ROM right hip with slightest movement.  CLINICAL DATA:  Fall, right hip pain  EXAM: DG HIP (WITH OR WITHOUT PELVIS) 2-3V RIGHT  COMPARISON:  None.  FINDINGS: Single view radiograph of the pelvis and two view radiograph of the right hip demonstrates an acute transcervical femoral neck  fracture with mild impaction and external rotation of the femoral head with acetabulum. The femoral head remains seated within the acetabulum. There is mild bilateral degenerative hip disease with joint space pelvis and sacrum appear intact  IMPRESSION: Acute, impacted transcervical right femoral neck fracture. Femoral head is seated within the acetabulum but demonstrates mild external rotation.   Electronically Signed   By: Helyn Numbers MD   On: 05/13/2020 20:18   Assessment: Closed right impacted femoral neck fracture, valgas. Right foot congenital deformity  Plan: Plan for admit to [facility], surgical treatment with closed manipulation and internal fixation of the right closed impacted femoral neck fracture with cannulated screws. Bed rest non weight bearing right leg.   The risks benefits and alternatives were discussed with the patient including but not limited to the risks of nonoperative treatment, versus surgical intervention including infection, bleeding, nerve injury,  blood clots, cardiopulmonary complications, morbidity, mortality, among others, and they were willing to proceed.   Vira Browns, MD Cell [phone] Office [phone] 05/14/2020 12:38 AM

## 2020-05-14 NOTE — ED Notes (Signed)
[facility] notifed (Tammy) - patient ready for transport

## 2020-05-14 NOTE — Transfer of Care (Signed)
Immediate Anesthesia Transfer of Care Note  Patient: Richard Todd  Procedure(s) Performed: CANNULATED HIP PINNING (Right Hip)  Patient Location: PACU  Anesthesia Type:General  Level of Consciousness: awake, drowsy, patient cooperative and responds to stimulation  Airway & Oxygen Therapy: Patient Spontanous Breathing and Patient connected to face mask oxygen  Post-op Assessment: Report given to RN and Post -op Vital signs reviewed and stable  Post vital signs: Reviewed and stable  Last Vitals:  Vitals Value Taken Time  BP 121/70   Temp    Pulse 70 05/14/20 1519  Resp 16 05/14/20 1519  SpO2 100 % 05/14/20 1519  Vitals shown include unvalidated device data.  Last Pain:  Vitals:   05/14/20 1230  TempSrc: Oral  PainSc:       Patients Stated Pain Goal: 2 (05/14/20 0222)  Complications: No complications documented.

## 2020-05-14 NOTE — Anesthesia Procedure Notes (Signed)
Procedure Name: Intubation Date/Time: 05/14/2020 1:26 PM Performed by: Maxwell Caul, CRNA Pre-anesthesia Checklist: Patient identified, Emergency Drugs available, Suction available and Patient being monitored Patient Re-evaluated:Patient Re-evaluated prior to induction Oxygen Delivery Method: Circle system utilized Preoxygenation: Pre-oxygenation with 100% oxygen Induction Type: IV induction Ventilation: Mask ventilation without difficulty Laryngoscope Size: Mac and 4 Grade View: Grade I Tube type: Oral Tube size: 7.5 mm Number of attempts: 1 Airway Equipment and Method: Stylet Placement Confirmation: ETT inserted through vocal cords under direct vision,  positive ETCO2 and breath sounds checked- equal and bilateral Secured at: 22 cm Tube secured with: Tape Dental Injury: Teeth and Oropharynx as per pre-operative assessment

## 2020-05-15 ENCOUNTER — Encounter (HOSPITAL_COMMUNITY): Payer: Self-pay | Admitting: Specialist

## 2020-05-15 ENCOUNTER — Other Ambulatory Visit: Payer: Self-pay | Admitting: Specialist

## 2020-05-15 DIAGNOSIS — E559 Vitamin D deficiency, unspecified: Secondary | ICD-10-CM | POA: Diagnosis present

## 2020-05-15 LAB — CBC
HCT: 42.4 % (ref 39.0–52.0)
Hemoglobin: 13.6 g/dL (ref 13.0–17.0)
MCH: 27.5 pg (ref 26.0–34.0)
MCHC: 32.1 g/dL (ref 30.0–36.0)
MCV: 85.8 fL (ref 80.0–100.0)
Platelets: 135 10*3/uL — ABNORMAL LOW (ref 150–400)
RBC: 4.94 MIL/uL (ref 4.22–5.81)
RDW: 13.6 % (ref 11.5–15.5)
WBC: 8.4 10*3/uL (ref 4.0–10.5)
nRBC: 0 % (ref 0.0–0.2)

## 2020-05-15 MED ORDER — METHOCARBAMOL 500 MG PO TABS: 500.0000 mg | ORAL_TABLET | Freq: Three times a day (TID) | ORAL | 1 refills | Status: AC | PRN

## 2020-05-15 MED ORDER — HYDROCODONE-ACETAMINOPHEN 5-325 MG PO TABS: 1.0000 | ORAL_TABLET | Freq: Four times a day (QID) | ORAL | 0 refills | Status: AC | PRN

## 2020-05-15 MED ORDER — DOCUSATE SODIUM 100 MG PO CAPS: 100.0000 mg | ORAL_CAPSULE | Freq: Two times a day (BID) | ORAL | 0 refills | Status: AC

## 2020-05-15 MED ORDER — VITAMIN D (ERGOCALCIFEROL) 1.25 MG (50000 UNIT) PO CAPS: 50000.0000 [IU] | ORAL_CAPSULE | ORAL | 1 refills | Status: AC

## 2020-05-15 NOTE — Discharge Instructions (Signed)
    Keep dressing dry. May use tub chair to shower  Call if there is odor or saturation of dressing or worsening pain not controlled with medications. Call if fever greater than 101.5. Use crutches or walker touchdown weight bearing on the right leg. Please follow up with an appointment with Dr. Otelia Sergeant  2 weeks from the time of surgery [phone]. Elevate as often as possible during the first week after surgery gradually increasing the time the leg is dependent or down there after. If swelling recurrs then elevate again. Wheel chair for longer distances.Apply ice to the surgery site as needed to relieve pain. Take aspirin 325 mg every day with food or snack

## 2020-05-15 NOTE — Progress Notes (Signed)
     Subjective: 1 Day Post-Op Procedure(s) (LRB): CANNULATED HIP PINNING (Right) Milld discomfort, moving right leg better less right hip pain.  Patient reports pain as mild.    Objective:   VITALS:  Temp:  [97.5 F (36.4 C)-99.3 F (37.4 C)] 98.3 F (36.8 C) (08/11 0943) Pulse Rate:  [55-77] 67 (08/11 0943) Resp:  [15-18] 18 (08/11 0943) BP: (117-138)/(63-87) 133/76 (08/11 0943) SpO2:  [96 %-100 %] 98 % (08/11 0943)  Neurologically intact ABD soft Neurovascular intact Sensation intact distally Intact pulses distally Dorsiflexion/Plantar flexion intact Incision: dressing C/D/I and no drainage   LABS Recent Labs    05/13/20 2339 05/15/20 0247  HGB 15.5 13.6  WBC 11.5* 8.4  PLT 175 135*   Recent Labs    05/13/20 2339  NA 138  K 3.7  CL 102  CO2 24  BUN 14  CREATININE 0.61  GLUCOSE 105*   No results for input(s): LABPT, INR in the last 72 hours.   Assessment/Plan: 1 Day Post-Op Procedure(s) (LRB): CANNULATED HIP PINNING (Right)  Advance diet Up with therapy D/C IV fluids  Home health to be set up. If he does well with PT then consider discharge home today.   Vira Browns 05/15/2020, 10:06 AMPatient ID: Richard Todd, male   DOB: [birth year], 74 y.o.   MRN: [id number]

## 2020-05-15 NOTE — Progress Notes (Signed)
Physical Therapy Treatment Patient Details Name: Richard Todd MRN: [id number] DOB: 22-Jun-1946 Today's Date: 05/15/2020    History of Present Illness Pt is a 74 year old male s/p R hip pinning due to fracture.  PMHx significant for scoliosis and right foot surgery and multiple casting as a child    PT Comments    Discussed differences in crutches vs RW for stability and safety.  Pt agreeable to use RW and performed gait training and stair negotiation with RW.  Pt better able to maintain TDWB with RW as well.  Pt aware to perform gentle exercises (no SLR) and activity in moderation.  Pt eager to d/c home today.  Spouse present and observed session.   Follow Up Recommendations  Follow surgeon's recommendation for DC plan and follow-up therapies     Equipment Recommendations  3in1 (PT);Rolling walker with 5" wheels    Recommendations for Other Services       Precautions / Restrictions Precautions Precautions: Fall Restrictions Weight Bearing Restrictions: Yes RLE Weight Bearing: Touchdown weight bearing    Mobility  Bed Mobility Overal bed mobility: Needs Assistance Bed Mobility: Supine to Sit     Supine to sit: Supervision     General bed mobility comments: pt in recliner  Transfers Overall transfer level: Needs assistance Equipment used: Rolling walker (2 wheeled) Transfers: Sit to/from Stand Sit to Stand: Min guard         General transfer comment: min/guard for safety, cues for safe use of RW  Ambulation/Gait Ambulation/Gait assistance: Min guard Gait Distance (Feet): 80 Feet Assistive device: Rolling walker (2 wheeled) Gait Pattern/deviations: Antalgic;Step-through pattern     General Gait Details: verbal cues for TDWB, pt agreeable that RW easier to maintain TDWB   Stairs Stairs: Yes Stairs assistance: Mod assist;Min guard Stair Management: Step to pattern Number of Stairs: 3 General stair comments: pt performed with crutches however requiring mod  assist for stability and maintaining WBing status, educated on RW backwards with spouse holding RW, pt able to perform this only min/guard assist and maintained TDWB very well, provided handout   Wheelchair Mobility    Modified Rankin (Stroke Patients Only)       Balance                                            Cognition Arousal/Alertness: Awake/alert Behavior During Therapy: WFL for tasks assessed/performed Overall Cognitive Status: Within Functional Limits for tasks assessed                                        Exercises Total Joint Exercises Ankle Circles/Pumps: AROM;Both;5 reps Quad Sets: AROM;Both;5 reps    General Comments        Pertinent Vitals/Pain Pain Assessment: 0-10 Pain Score: 1  Pain Location: R hip Pain Descriptors / Indicators: Aching;Sore Pain Intervention(s): Repositioned;Monitored during session;Ice applied    Home Living                      Prior Function            PT Goals (current goals can now be found in the care plan section) Acute Rehab PT Goals PT Goal Formulation: With patient Time For Goal Achievement: 05/22/20 Potential to Achieve Goals: Good Progress towards PT goals:  Progressing toward goals    Frequency    7X/week      PT Plan Current plan remains appropriate    Co-evaluation              AM-PAC PT "6 Clicks" Mobility   Outcome Measure  Help needed turning from your back to your side while in a flat bed without using bedrails?: A Little Help needed moving from lying on your back to sitting on the side of a flat bed without using bedrails?: A Little Help needed moving to and from a bed to a chair (including a wheelchair)?: A Little Help needed standing up from a chair using your arms (Todd.g., wheelchair or bedside chair)?: A Little Help needed to walk in hospital room?: A Little Help needed climbing 3-5 steps with a railing? : A Little 6 Click Score: 18     End of Session Equipment Utilized During Treatment: Gait belt Activity Tolerance: No increased pain;Patient tolerated treatment well Patient left: in chair;with chair alarm set;with call bell/phone within reach;with family/visitor present Nurse Communication: Mobility status PT Visit Diagnosis: Difficulty in walking, not elsewhere classified (R26.2)     Time: 1435-1500 PT Time Calculation (min) (ACUTE ONLY): 25 min  Charges:  $Gait Training: 23-37 mins                    Thomasene Mohair PT, DPT Acute Rehabilitation Services Pager: [phone] Office: [phone]  Richard Todd 05/15/2020, 3:29 PM

## 2020-05-15 NOTE — TOC Transition Note (Signed)
Transition of Care [facility]) - CM/SW Discharge Note   Patient Details  Name: Richard Todd MRN: [id number] Date of Birth: 08/18/1946  [facility]) CM/SW Contact:  Amada Jupiter, LCSW Phone Number: 05/15/2020, 3:15 PM   Clinical Narrative:    Pt cleared for dc today.  DME referrals placed.  No f/u tx planned.     Final next level of care: Home/Self Care Barriers to Discharge: No Barriers Identified   Patient Goals and CMS Choice Patient states their goals for this hospitalization and ongoing recovery are:: go home      Discharge Placement                       Discharge Plan and Services                DME Arranged: 3-N-1, Walker rolling DME Agency: [employer] Date DME Agency Contacted: 05/15/20 Time DME Agency Contacted: 1515 Representative spoke with at DME Agency: [place] HH Arranged: NA HH Agency: NA        Social Determinants of Health (SDOH) Interventions     Readmission Risk Interventions Readmission Risk Prevention Plan 05/15/2020  Post Dischage Appt Complete  Medication Screening Complete  Transportation Screening Complete

## 2020-05-15 NOTE — Evaluation (Signed)
Physical Therapy Evaluation Patient Details Name: Richard Todd MRN: [id number] DOB: [birth year] Today's Date: 05/15/2020   History of Present Illness  Pt is a 74 year old male s/p R hip pinning due to fracture.  PMHx significant for scoliosis and right foot surgery and multiple casting as a child  Clinical Impression  Patient is s/p above surgery resulting in functional limitations due to the deficits listed below (see PT Problem List).  Patient will benefit from skilled PT to increase their independence and safety with mobility to allow discharge to the venue listed below.  Pt ambulated in hallway with crutches POD #1.  Pt would prefer to d/c home today so will return to practice safe stair technique.      Follow Up Recommendations Follow surgeon's recommendation for DC plan and follow-up therapies    Equipment Recommendations  3in1 (PT)    Recommendations for Other Services       Precautions / Restrictions Precautions Precautions: Fall Restrictions Weight Bearing Restrictions: Yes RLE Weight Bearing: Touchdown weight bearing      Mobility  Bed Mobility Overal bed mobility: Needs Assistance Bed Mobility: Supine to Sit     Supine to sit: Supervision     General bed mobility comments: cues for technique  Transfers Overall transfer level: Needs assistance Equipment used: Rolling walker (2 wheeled) Transfers: Sit to/from Stand Sit to Stand: Min guard         General transfer comment: min/guard for safety, started with RW however upon standing pt really wanted to use crutches since he used them child/teenage years for foot/ankle history  Ambulation/Gait Ambulation/Gait assistance: Min guard Gait Distance (Feet): 160 Feet Assistive device: Crutches Gait Pattern/deviations: Step-through pattern;Antalgic     General Gait Details: pt keeping right heel off floor, denies any increased pain, pt states he was performing more TDWB when returned to room to verify WBing  status, prefers to use crutches  Stairs            Wheelchair Mobility    Modified Rankin (Stroke Patients Only)       Balance                                             Pertinent Vitals/Pain Pain Assessment: 0-10 Pain Score: 3  Pain Location: R hip Pain Descriptors / Indicators: Aching;Sore Pain Intervention(s): Repositioned;Monitored during session    Home Living Family/patient expects to be discharged to:: Private residence Living Arrangements: Spouse/significant other   Type of Home: House Home Access: Stairs to enter   Secretary/administrator of Steps: 3 Home Layout: One level Home Equipment: Shower seat - built in;Crutches      Prior Function Level of Independence: Independent         Comments: makes guitars     Hand Dominance        Extremity/Trunk Assessment        Lower Extremity Assessment Lower Extremity Assessment: RLE deficits/detail RLE Deficits / Details: grossly at least 2+/5 hip strength per observation, able to ankle pumps ( somewhat restricted ankle movement on Right due to history)       Communication   Communication: No difficulties  Cognition Arousal/Alertness: Awake/alert Behavior During Therapy: WFL for tasks assessed/performed Overall Cognitive Status: Within Functional Limits for tasks assessed  General Comments      Exercises Total Joint Exercises Ankle Circles/Pumps: AROM;Both;5 reps Quad Sets: AROM;Both;5 reps   Assessment/Plan    PT Assessment Patient needs continued PT services  PT Problem List Decreased strength;Decreased activity tolerance;Decreased balance;Decreased knowledge of use of DME;Decreased mobility;Decreased knowledge of precautions       PT Treatment Interventions DME instruction;Gait training;Balance training;Therapeutic exercise;Functional mobility training;Therapeutic activities;Stair training;Patient/family  education    PT Goals (Current goals can be found in the Care Plan section)  Acute Rehab PT Goals PT Goal Formulation: With patient Time For Goal Achievement: 05/22/20 Potential to Achieve Goals: Good    Frequency 7X/week   Barriers to discharge        Co-evaluation               AM-PAC PT "6 Clicks" Mobility  Outcome Measure Help needed turning from your back to your side while in a flat bed without using bedrails?: A Little Help needed moving from lying on your back to sitting on the side of a flat bed without using bedrails?: A Little Help needed moving to and from a bed to a chair (including a wheelchair)?: A Little Help needed standing up from a chair using your arms (e.g., wheelchair or bedside chair)?: A Little Help needed to walk in hospital room?: A Little Help needed climbing 3-5 steps with a railing? : A Little 6 Click Score: 18    End of Session Equipment Utilized During Treatment: Gait belt Activity Tolerance: No increased pain;Patient tolerated treatment well Patient left: in chair;with chair alarm set;with call bell/phone within reach;with family/visitor present Nurse Communication: Mobility status PT Visit Diagnosis: Difficulty in walking, not elsewhere classified (R26.2)    Time: 5701-7793 PT Time Calculation (min) (ACUTE ONLY): 22 min   Charges:   PT Evaluation $PT Eval Low Complexity: 1 Low     Kati PT, DPT Acute Rehabilitation Services Pager: [phone] Office: [phone]  Maida Sale E 05/15/2020, 11:53 AM

## 2020-05-19 ENCOUNTER — Other Ambulatory Visit: Payer: Self-pay

## 2020-05-19 ENCOUNTER — Emergency Department (HOSPITAL_BASED_OUTPATIENT_CLINIC_OR_DEPARTMENT_OTHER)
Admission: EM | Admit: 2020-05-19 | Discharge: 2020-05-20 | Disposition: A | Discharge status: Home / Self Care | Payer: Medicare Other | Attending: Emergency Medicine | Admitting: Emergency Medicine

## 2020-05-19 ENCOUNTER — Encounter (HOSPITAL_BASED_OUTPATIENT_CLINIC_OR_DEPARTMENT_OTHER): Payer: Self-pay

## 2020-05-19 DIAGNOSIS — K5641 Fecal impaction: Secondary | ICD-10-CM | POA: Diagnosis not present

## 2020-05-19 DIAGNOSIS — Z87891 Personal history of nicotine dependence: Secondary | ICD-10-CM | POA: Diagnosis not present

## 2020-05-19 DIAGNOSIS — K5903 Drug induced constipation: Secondary | ICD-10-CM

## 2020-05-19 DIAGNOSIS — K6289 Other specified diseases of anus and rectum: Secondary | ICD-10-CM | POA: Diagnosis present

## 2020-05-19 NOTE — ED Notes (Signed)
[facility] contacted - patient requires transport

## 2020-05-19 NOTE — ED Notes (Signed)
EDP at bedside  

## 2020-05-19 NOTE — ED Provider Notes (Signed)
[facility] EMERGENCY DEPARTMENT Provider Note  CSN: [id number] Arrival date & time: 05/19/20 2113    History Chief Complaint  Patient presents with  . Constipation    HPI  Richard Todd is a 74 y.o. male with history of recent hip pinning reports he has not had a good bowel movement since surgery about a week ago. Here with severe rectal pain and feeling like he needs to have a BM. No blood from rectum.    Past Medical History:  Diagnosis Date  . Fractures   . Scoliosis     Past Surgical History:  Procedure Laterality Date  . FOOT SURGERY    . HIP PINNING,CANNULATED Right 05/14/2020   Procedure: CANNULATED HIP PINNING;  Surgeon: Kerrin Champagne, MD;  Location: [facility] ORS;  Service: Orthopedics;  Laterality: Right;    Family History  Problem Relation Age of Onset  . Cancer Father     Social History   Tobacco Use  . Smoking status: Former Smoker    Packs/day: 1.00    Years: 10.00    Pack years: 10.00    Quit date: 05/14/1977    Years since quitting: 43.0  . Smokeless tobacco: Never Used  Vaping Use  . Vaping Use: Never used  Substance Use Topics  . Alcohol use: Yes    Comment: social  . Drug use: Never     Home Medications Prior to Admission medications   Medication Sig Start Date End Date Taking? Authorizing Provider  Ascorbic Acid (VITAMIN C PO) Take 1 tablet by mouth daily.    [provider]  docusate sodium (COLACE) 100 MG capsule Take 1 capsule (100 mg total) by mouth 2 (two) times daily. 05/15/20   Kerrin Champagne, MD  HYDROcodone-acetaminophen (NORCO/VICODIN) 5-325 MG tablet Take 1-2 tablets by mouth every 6 (six) hours as needed for moderate pain. 05/15/20   Kerrin Champagne, MD  methocarbamol (ROBAXIN) 500 MG tablet Take 1 tablet (500 mg total) by mouth every 8 (eight) hours as needed for muscle spasms. 05/15/20   Kerrin Champagne, MD  Multiple Vitamin (MULTIVITAMIN WITH MINERALS) TABS tablet Take 1 tablet by mouth daily.    [provider]  polyvinyl alcohol (LIQUIFILM TEARS) 1.4 % ophthalmic solution Place 1 drop into both eyes as needed for dry eyes.    [provider]  Vitamin D, Ergocalciferol, (DRISDOL) 1.25 MG (50000 UNIT) CAPS capsule Take 1 capsule (50,000 Units total) by mouth every 7 (seven) days. 05/15/20   Kerrin Champagne, MD     Allergies    Amoxicillin   Review of Systems   Review of Systems A comprehensive review of systems was completed and negative except as noted in HPI.    Physical Exam BP (!) 148/75 (BP Location: Right Arm)   Pulse 81   Temp 98.6 F (37 C) (Oral)   Resp 20   Ht 6' (1.829 m)   Wt 84 kg   SpO2 100%   BMI 25.12 kg/m   Physical Exam Vitals and nursing note reviewed.  Constitutional:      Appearance: Normal appearance.  HENT:     Head: Normocephalic and atraumatic.     Nose: Nose normal.     Mouth/Throat:     Mouth: Mucous membranes are moist.  Eyes:     Extraocular Movements: Extraocular movements intact.     Conjunctiva/sclera: Conjunctivae normal.  Cardiovascular:     Rate and Rhythm: Normal rate.  Pulmonary:  Effort: Pulmonary effort is normal.     Breath sounds: Normal breath sounds.  Abdominal:     General: Abdomen is flat.     Palpations: Abdomen is soft.     Tenderness: There is no abdominal tenderness.  Genitourinary:    Comments: See note from [place], [facility] Musculoskeletal:        General: No swelling. Normal range of motion.     Cervical back: Neck supple.  Skin:    General: Skin is warm and dry.  Neurological:     General: No focal deficit present.     Mental Status: He is alert.  Psychiatric:        Mood and Affect: Mood normal.      ED Results / Procedures / Treatments   Labs (all labs ordered are listed, but only abnormal results are displayed) Labs Reviewed - No data to display  EKG None   Radiology No results found.  Procedures Procedures  Medications Ordered in the ED Medications - No data to  display   MDM Rules/Calculators/A&P MDM Patient disimpacted by [facility] prior to my evaluation. Patient report large BM post disimpaction and significant relief of symptoms. Patient ready to go home. Recommend continued miralax/colace bowel regimen. Mobility as recommended by Ortho and RTED for any other concerns.  ED Course  I have reviewed the triage vital signs and the nursing notes.  Pertinent labs & imaging results that were available during my care of the patient were reviewed by me and considered in my medical decision making (see chart for details).     Final Clinical Impression(s) / ED Diagnoses Final diagnoses:  Fecal impaction in rectum [facility])  Constipation due to opioid therapy    Rx / DC Orders ED Discharge Orders    None       Pollyann Savoy, MD 05/19/20 2321

## 2020-05-19 NOTE — ED Triage Notes (Signed)
PT arrived via [facility]. Pt has recent hip replacement last week. Pt has not had a BM since he has been home from surgery. Per EMS pt's abd is soft and non-tender. Pt has tried Miralax and enema without relief.

## 2020-05-19 NOTE — ED Provider Notes (Signed)
I performed manual disimpaction per request of Dr. Bernette Mayers.    Fecal disimpaction  Date/Time: 05/19/2020 11:20 PM Performed by: Fayrene Helper, PA-C Authorized by: Fayrene Helper, PA-C  Local anesthesia used: no  Anesthesia: Local anesthesia used: no  Sedation: Patient sedated: no  Patient tolerance: patient tolerated the procedure well with no immediate complications Comments: Patient was laying in a right lateral decubitus position.  Sufficient lubrication was inserted with manual disimpaction to loosen up hard stool that was palpated in the rectal vault.  No thrombosed hemorrhoid, no active bleeding, patient tolerates with some difficulty.  Some stool was able to evacuated.       Fayrene Helper, PA-C 05/19/20 2321    Pollyann Savoy, MD 05/19/20 [phone]

## 2020-05-20 NOTE — ED Notes (Signed)
Pt left assistive device in room; attempted to call both spouse and pt mobile numbers; left messages on both instructing them to call back; pt label placed on walker

## 2020-05-20 NOTE — ED Notes (Signed)
Pt came and collected walker

## 2020-05-25 NOTE — Discharge Summary (Signed)
Physician Discharge Summary      Patient ID: Richard Todd MRN: [id number] DOB/AGE: August 18, 1946 74 y.o.  Admit date: 05/13/2020 Discharge date: 05/15/2020  Admission Diagnoses:  Active Problems:   Vitamin D deficiency   Hip fracture (HCC)   Closed displaced fracture of right femoral neck [facility])   Discharge Diagnoses:  Same  Past Medical History:  Diagnosis Date  . Fractures   . Scoliosis     Surgeries: Procedure(s): CANNULATED HIP PINNING on 05/14/2020   Consultants:   Discharged Condition: Improved  Hospital Course: Richard Todd is an 74 y.o. male who was admitted 05/13/2020 with a chief complaint of  Chief Complaint  Patient presents with  . Fall  . Hip Pain  , and found to have a diagnosis of right impacted valgus femoral neck fracture.  He was brought to the operating room on 05/14/2020 and underwent the above named procedures.    He was given perioperative antibiotics:  Anti-infectives (From admission, onward)   Start     Dose/Rate Route Frequency Ordered Stop   05/14/20 0600  ceFAZolin (ANCEF) IVPB 2g/100 mL premix        2 g 200 mL/hr over 30 Minutes Intravenous On call to O.R. 05/14/20 0219 05/14/20 1400    Post op recovery in PACU was unevenful, transferred to [address] [facility] and there he continued to recover from hip surgery. His pain was improved post op and he started a PT program. VS remained stable. No catheter was placed during admission. POD #1 he participated in therapy and demonstrated excellent tolerance of standing and ambulating touch down weight bearing on the right leg. Dressing remained in place. Tolerated po nourishment and po narcotic medications for pain control. Hydrocodone remained adequate for controlling his discomfort. He was able to demonstrate excellent independent transfers and transition from lying down to sitting. He was able to walk stairs the afternoon of POD #1. PT and OT indicated no need for follow up. He was able to discharged home on POD#1.  His laboratory demonstrated low Vitamin D level so a prescription for vitamin D supplement and replacement treatment was started.   He was given sequential compression devices, early ambulation, and chemoprophylaxis of aspirin for DVT prophylaxis.  He benefited maximally from their hospital stay and there were no complications.    Recent vital signs:  Vitals:   05/15/20 0943 05/15/20 1351  BP: 133/76 133/73  Pulse: 67 63  Resp: 18 18  Temp: 98.3 F (36.8 C)   SpO2: 98% 97%    Recent laboratory studies:  Results for orders placed or performed during the hospital encounter of 05/13/20  SARS Coronavirus 2 by RT PCR (hospital order, performed in [facility] hospital lab) Nasopharyngeal Nasopharyngeal Swab   Specimen: Nasopharyngeal Swab  Result Value Ref Range   SARS Coronavirus 2 NEGATIVE NEGATIVE  MRSA PCR Screening   Specimen: Nasal Mucosa; Nasopharyngeal  Result Value Ref Range   MRSA by PCR NEGATIVE NEGATIVE  CBC with Differential  Result Value Ref Range   WBC 11.5 (H) 4.0 - 10.5 K/uL   RBC 5.77 4.22 - 5.81 MIL/uL   Hemoglobin 15.5 13.0 - 17.0 g/dL   HCT 17.5 39 - 52 %   MCV 83.4 80.0 - 100.0 fL   MCH 26.9 26.0 - 34.0 pg   MCHC 32.2 30.0 - 36.0 g/dL   RDW 10.2 58.5 - 27.7 %   Platelets 175 150 - 400 K/uL   nRBC 0.0 0.0 - 0.2 %   Neutrophils Relative %  83 %   Neutro Abs 9.4 (H) 1.7 - 7.7 K/uL   Lymphocytes Relative 8 %   Lymphs Abs 0.9 0.7 - 4.0 K/uL   Monocytes Relative 9 %   Monocytes Absolute 1.0 0 - 1 K/uL   Eosinophils Relative 0 %   Eosinophils Absolute 0.0 0 - 0 K/uL   Basophils Relative 0 %   Basophils Absolute 0.0 0 - 0 K/uL   Immature Granulocytes 0 %   Abs Immature Granulocytes 0.04 0.00 - 0.07 K/uL  Basic metabolic panel  Result Value Ref Range   Sodium 138 135 - 145 mmol/L   Potassium 3.7 3.5 - 5.1 mmol/L   Chloride 102 98 - 111 mmol/L   CO2 24 22 - 32 mmol/L   Glucose, Bld 105 (H) 70 - 99 mg/dL   BUN 14 8 - 23 mg/dL   Creatinine, Ser 9.98 0.61  - 1.24 mg/dL   Calcium 9.1 8.9 - 33.8 mg/dL   GFR calc non Af Amer >60 >60 mL/min   GFR calc Af Amer >60 >60 mL/min   Anion gap 12 5 - 15  VITAMIN D 25 Hydroxy (Vit-D Deficiency, Fractures)  Result Value Ref Range   Vit D, 25-Hydroxy 23.71 (L) 30 - 100 ng/mL  CBC  Result Value Ref Range   WBC 8.4 4.0 - 10.5 K/uL   RBC 4.94 4.22 - 5.81 MIL/uL   Hemoglobin 13.6 13.0 - 17.0 g/dL   HCT 25.0 39 - 52 %   MCV 85.8 80.0 - 100.0 fL   MCH 27.5 26.0 - 34.0 pg   MCHC 32.1 30.0 - 36.0 g/dL   RDW 53.9 76.7 - 34.1 %   Platelets 135 (L) 150 - 400 K/uL   nRBC 0.0 0.0 - 0.2 %    Discharge Medications:   Allergies as of 05/15/2020      Reactions   Amoxicillin Swelling   During tooth extraction      Medication List    TAKE these medications   docusate sodium 100 MG capsule Commonly known as: COLACE Take 1 capsule (100 mg total) by mouth 2 (two) times daily.   HYDROcodone-acetaminophen 5-325 MG tablet Commonly known as: NORCO/VICODIN Take 1-2 tablets by mouth every 6 (six) hours as needed for moderate pain.   methocarbamol 500 MG tablet Commonly known as: ROBAXIN Take 1 tablet (500 mg total) by mouth every 8 (eight) hours as needed for muscle spasms.   multivitamin with minerals Tabs tablet Take 1 tablet by mouth daily.   polyvinyl alcohol 1.4 % ophthalmic solution Commonly known as: LIQUIFILM TEARS Place 1 drop into both eyes as needed for dry eyes.   VITAMIN C PO Take 1 tablet by mouth daily.   Vitamin D (Ergocalciferol) 1.25 MG (50000 UNIT) Caps capsule Commonly known as: DRISDOL Take 1 capsule (50,000 Units total) by mouth every 7 (seven) days.       Diagnostic Studies: Chest Portable 1 View  Result Date: 05/14/2020 CLINICAL DATA:  74 year old male preoperative study for hip surgery, hip fracture. EXAM: PORTABLE CHEST 1 VIEW COMPARISON:  Chest radiographs 01/16/2018. FINDINGS: Portable AP semi upright view at 0234 hours. Lung volumes and mediastinal contours are within  normal limits. Mild eventration of the right hemidiaphragm (normal variant). Visualized tracheal air column is within normal limits. Allowing for portable technique the lungs are clear. No pneumothorax or pleural effusion. No acute osseous abnormality identified. IMPRESSION: Negative portable chest. Electronically Signed   By: Odessa Fleming M.D.   On: 05/14/2020  02:54   DG C-Arm 1-60 Min-No Report  Result Date: 05/14/2020 Fluoroscopy was utilized by the requesting physician.  No radiographic interpretation.   DG HIP OPERATIVE UNILAT W OR W/O PELVIS RIGHT  Result Date: 05/14/2020 CLINICAL DATA:  Right hip pinning. EXAM: OPERATIVE RIGHT HIP (WITH PELVIS IF PERFORMED) TECHNIQUE: Fluoroscopic spot image(s) were submitted for interpretation post-operatively. COMPARISON:  Preoperative radiograph 05/13/2020 FINDINGS: Two fluoroscopic spot views obtained in the operating room in frontal and lateral projections. 4 screws traverse the intertrochanteric right femur fixating femoral neck fracture. Total fluoroscopy time 1 minutes 4 seconds. IMPRESSION: Intraoperative fluoroscopy during right hip fracture fixation. Electronically Signed   By: Narda Rutherford M.D.   On: 05/14/2020 16:11   DG HIP UNILAT WITH PELVIS 2-3 VIEWS RIGHT  Result Date: 05/13/2020 CLINICAL DATA:  Fall, right hip pain EXAM: DG HIP (WITH OR WITHOUT PELVIS) 2-3V RIGHT COMPARISON:  None. FINDINGS: Single view radiograph of the pelvis and two view radiograph of the right hip demonstrates an acute transcervical femoral neck fracture with mild impaction and external rotation of the femoral head with acetabulum. The femoral head remains seated within the acetabulum. There is mild bilateral degenerative hip disease with joint space pelvis and sacrum appear intact IMPRESSION: Acute, impacted transcervical right femoral neck fracture. Femoral head is seated within the acetabulum but demonstrates mild external rotation. Electronically Signed   By: Helyn Numbers  MD   On: 05/13/2020 20:18    Disposition: Discharge disposition: 01-Home or Self Care       Discharge Instructions    Call MD / Call 911   Complete by: As directed    If you experience chest pain or shortness of breath, CALL 911 and be transported to the hospital emergency room.  If you develope a fever above 101 F, pus (white drainage) or increased drainage or redness at the wound, or calf pain, call your surgeon's office.   Constipation Prevention   Complete by: As directed    Drink plenty of fluids.  Prune juice may be helpful.  You may use a stool softener, such as Colace (over the counter) 100 mg twice a day.  Use MiraLax (over the counter) for constipation as needed.   Diet - low sodium heart healthy   Complete by: As directed    Discharge instructions   Complete by: As directed    Keep dressing dry. May use tub chair to shower  Call if there is odor or saturation of dressing or worsening pain not controlled with medications. Call if fever greater than 101.5. Use crutches or walker touchdown weight bearing on the right leg. Please follow up with an appointment with Dr. Otelia Sergeant  2 weeks from the time of surgery [phone]. Elevate as often as possible during the first week after surgery gradually increasing the time the leg is dependent or down there after. If swelling recurrs then elevate again. Wheel chair for longer distances.Apply ice to the surgery site as needed to relieve pain. Take aspirin 325 mg every day with food or snack   Increase activity slowly as tolerated   Complete by: As directed        Follow-up Information    Kerrin Champagne, MD In 2 weeks.   Specialty: Orthopedic Surgery Why: For wound re-check Contact information: [address]  [place] [address] [phone]                Signed: Vira Browns 05/25/2020, 9:49 AM

## 2020-05-27 ENCOUNTER — Inpatient Hospital Stay: Payer: Medicare Other | Admitting: Specialist

## 2020-05-30 ENCOUNTER — Other Ambulatory Visit: Payer: Self-pay

## 2020-05-30 ENCOUNTER — Encounter: Payer: Self-pay | Admitting: Surgery

## 2020-05-30 ENCOUNTER — Ambulatory Visit: Payer: Self-pay

## 2020-05-30 ENCOUNTER — Ambulatory Visit (INDEPENDENT_AMBULATORY_CARE_PROVIDER_SITE_OTHER): Payer: Medicare Other | Admitting: Surgery

## 2020-05-30 VITALS — BP 125/78 | HR 77 | Ht 72.0 in | Wt 185.2 lb

## 2020-05-30 DIAGNOSIS — S72001A Fracture of unspecified part of neck of right femur, initial encounter for closed fracture: Secondary | ICD-10-CM

## 2020-05-30 NOTE — Progress Notes (Signed)
   Post-Op Visit Note   Patient: Richard Todd           Date of Birth: 1946-09-21           MRN: [id number] Visit Date: 05/30/2020 PCP: System, Pcp Not In   Assessment & Plan:  Chief Complaint:  Chief Complaint  Patient presents with  . Right Hip - Routine Post Op   Visit Diagnoses:  1. Closed fracture of right hip, initial encounter [facility])   74 year old white male returns.  He is 2 weeks status post cannulated hip pinning by Dr. Otelia Sergeant.  States that he has been compliant with touchdown weightbearing.  Plan: Dr. Otelia Sergeant also did come and see patient today.  He will continue touchdown weightbearing at least until return office visit in 4 weeks.  X-rays follow-up visit.  Follow-Up Instructions: Return in about 4 weeks (around 06/27/2020) for with dr Otelia Sergeant.   Orders:  Orders Placed This Encounter  Procedures  . XR HIP UNILAT W OR W/O PELVIS 2-3 VIEWS RIGHT   No orders of the defined types were placed in this encounter.   Imaging: No results found.  PMFS History: Patient Active Problem List   Diagnosis Date Noted  . Vitamin D deficiency 05/15/2020    Class: Chronic  . Closed displaced fracture of right femoral neck (HCC) 05/14/2020  . Hip fracture (HCC) 05/13/2020   Past Medical History:  Diagnosis Date  . Fractures   . Scoliosis     Family History  Problem Relation Age of Onset  . Cancer Father     Past Surgical History:  Procedure Laterality Date  . FOOT SURGERY    . HIP PINNING,CANNULATED Right 05/14/2020   Procedure: CANNULATED HIP PINNING;  Surgeon: Kerrin Champagne, MD;  Location: [facility];  Service: Orthopedics;  Laterality: Right;   Social History   Occupational History  . 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 Smoking status: Former Smoker    Packs/day: 1.00    Years: 10.00    Pack years: 10.00    Quit date: 05/14/1977    Years since quitting: 43.0  . Smokeless tobacco: Never Used  Vaping Use  . Vaping Use:  Never used  Substance and Sexual Activity  . Alcohol use: Yes    Comment: social  . Drug use: Never  . Sexual activity: Not on file   Exam Pleasant male alert and oriented in no acute distress.  Hip wound looks good.  New Steri-Strips applied.  No drainage or signs of infection.  He is ambulating well with his walker.

## 2020-06-26 ENCOUNTER — Ambulatory Visit (INDEPENDENT_AMBULATORY_CARE_PROVIDER_SITE_OTHER): Payer: Medicare Other | Admitting: Specialist

## 2020-06-26 ENCOUNTER — Other Ambulatory Visit: Payer: Self-pay

## 2020-06-26 ENCOUNTER — Ambulatory Visit: Payer: Self-pay

## 2020-06-26 ENCOUNTER — Encounter: Payer: Self-pay | Admitting: Specialist

## 2020-06-26 VITALS — BP 136/75 | HR 66 | Ht 72.0 in | Wt 185.0 lb

## 2020-06-26 DIAGNOSIS — S72001A Fracture of unspecified part of neck of right femur, initial encounter for closed fracture: Secondary | ICD-10-CM

## 2020-06-26 NOTE — Progress Notes (Signed)
   Post-Op Visit Note   Patient: Richard Todd           Date of Birth: 06-17-1946           MRN: [id number] Visit Date: 06/26/2020 PCP: Pcp, No   Assessment & Plan:  Chief Complaint:  Chief Complaint  Patient presents with  . Right Hip - Pain   Visit Diagnoses:  1. Closed fracture of right hip, initial encounter (HCC)   Hip ROM is not painful, IR is 15 degrees, ER is 30 degrees,  Motor is normal. Radiographs healing right transcervical hip fracture Plan:     Call if fever or chills or increased drainage. Go to ER if acutely short of breath or call for ambulance. Return for follow up in 2 weeks. May partial weight bear 25 % on the surgical leg for one week then increase to 50% weight bearing right leg.  unless told otherwise.    Follow-Up Instructions: No follow-ups on file.   Orders:  Orders Placed This Encounter  Procedures  . XR HIP UNILAT W OR W/O PELVIS 2-3 VIEWS RIGHT   No orders of the defined types were placed in this encounter.   Imaging: No results found.  PMFS History: Patient Active Problem List   Diagnosis Date Noted  . Vitamin D deficiency 05/15/2020    Priority: Medium    Class: Chronic  . Closed displaced fracture of right femoral neck (HCC) 05/14/2020  . Hip fracture (HCC) 05/13/2020   Past Medical History:  Diagnosis Date  . Fractures   . Scoliosis     Family History  Problem Relation Age of Onset  . Cancer Father     Past Surgical History:  Procedure Laterality Date  . FOOT SURGERY    . HIP PINNING,CANNULATED Right 05/14/2020   Procedure: CANNULATED HIP PINNING;  Surgeon: Kerrin Champagne, MD;  Location: [facility];  Service: Orthopedics;  Laterality: Right;   Social History   Occupational History  . 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 Smoking status: Former Smoker    Packs/day: 1.00    Years: 10.00    Pack years: 10.00    Quit date: 05/14/1977    Years since quitting: 43.1  .  Smokeless tobacco: Never Used  Vaping Use  . Vaping Use: Never used  Substance and Sexual Activity  . Alcohol use: Yes    Comment: social  . Drug use: Never  . Sexual activity: Not on file

## 2020-06-26 NOTE — Patient Instructions (Signed)
  Call if fever or chills or increased drainage. Go to ER if acutely short of breath or call for ambulance. Return for follow up in 2 weeks. May partial weight bear 25 % on the surgical leg for one week then increase to 50% weight bearing right leg.

## 2020-06-27 ENCOUNTER — Ambulatory Visit: Payer: Medicare Other | Admitting: Specialist

## 2020-07-04 ENCOUNTER — Ambulatory Visit: Payer: Medicare Other | Admitting: Specialist

## 2020-07-12 ENCOUNTER — Ambulatory Visit (INDEPENDENT_AMBULATORY_CARE_PROVIDER_SITE_OTHER): Payer: Medicare Other

## 2020-07-12 ENCOUNTER — Ambulatory Visit (INDEPENDENT_AMBULATORY_CARE_PROVIDER_SITE_OTHER): Payer: Medicare Other | Admitting: Family

## 2020-07-12 ENCOUNTER — Encounter: Payer: Self-pay | Admitting: Family

## 2020-07-12 VITALS — BP 130/78 | HR 59 | Ht 72.0 in | Wt 185.0 lb

## 2020-07-12 DIAGNOSIS — S72001A Fracture of unspecified part of neck of right femur, initial encounter for closed fracture: Secondary | ICD-10-CM

## 2020-07-12 NOTE — Progress Notes (Signed)
   Post-Op Visit Note   Patient: Richard Todd           Date of Birth: 12-Sep-1946           MRN: [id number] Visit Date: 07/12/2020 PCP: Pcp, No   Assessment & Plan:8 weeks post op pinning of right hip  Chief Complaint: No chief complaint on file.  Visit Diagnoses:  1. Closed fracture of right hip, initial encounter (HCC)    Awake, alert and oriented x 4. Incision is healed per patient Radiographs with good P&A of fracture, no lucency, no sclerosis or Hawkins negative sign, he has Normal density compared with the femoral neck and intertrochanteric area.  Plan: May full weight bearing as tolerated with a cane in the left hand or the crutches or walker. Discontinue use of aspirin. Return in 3 weeks for xray.  Follow-Up Instructions: Return in about 3 weeks (around 08/02/2020) for overbook.   Orders:  No orders of the defined types were placed in this encounter.  No orders of the defined types were placed in this encounter.   Imaging: No results found.  PMFS History: Patient Active Problem List   Diagnosis Date Noted   Vitamin D deficiency 05/15/2020    Priority: Medium    Class: Chronic   Closed displaced fracture of right femoral neck (HCC) 05/14/2020   Hip fracture (HCC) 05/13/2020   Past Medical History:  Diagnosis Date   Fractures    Scoliosis     Family History  Problem Relation Age of Onset   Cancer Father     Past Surgical History:  Procedure Laterality Date   FOOT SURGERY     HIP PINNING,CANNULATED Right 05/14/2020   Procedure: CANNULATED HIP PINNING;  Surgeon: Kerrin Champagne, MD;  Location: [facility];  Service: Orthopedics;  Laterality: Right;   Social History   Occupational History   Occupation: Brewing technologist: Production assistant, radio FOR SELF EMPLOYED  Tobacco Use   Smoking status: Former Smoker    Packs/day: 1.00    Years: 10.00    Pack years: 10.00    Quit date: 05/14/1977    Years since quitting: 43.1   Smokeless tobacco: Never Used  Vaping  Use   Vaping Use: Never used  Substance and Sexual Activity   Alcohol use: Yes    Comment: social   Drug use: Never   Sexual activity: Not on file

## 2020-07-12 NOTE — Patient Instructions (Signed)
May full weight bearing as tolerated with a cane in the left hand or the crutches or walker. Discontinue use of aspirin. Return in 3 weeks for xray.

## 2020-08-01 ENCOUNTER — Ambulatory Visit (INDEPENDENT_AMBULATORY_CARE_PROVIDER_SITE_OTHER): Payer: Medicare Other | Admitting: Specialist

## 2020-08-01 ENCOUNTER — Encounter: Payer: Self-pay | Admitting: Specialist

## 2020-08-01 ENCOUNTER — Other Ambulatory Visit: Payer: Self-pay

## 2020-08-01 ENCOUNTER — Ambulatory Visit: Payer: Self-pay

## 2020-08-01 VITALS — BP 132/80 | HR 64 | Ht 72.0 in | Wt 185.0 lb

## 2020-08-01 DIAGNOSIS — S72001A Fracture of unspecified part of neck of right femur, initial encounter for closed fracture: Secondary | ICD-10-CM | POA: Diagnosis not present

## 2020-08-01 NOTE — Progress Notes (Signed)
Post-Op Visit Note   Patient: Richard SizerRobert Todd           Date of Birth: 01/01/1946           MRN: [id number] Visit Date: 08/01/2020 PCP: Pcp, No   Assessment & Plan:11 week post op right hip femoral neck fracture, healing well.  Chief Complaint:  Chief Complaint  Patient presents with  . Right Hip - Follow-up, Routine Post Op   Visit Diagnoses:  1. Closed fracture of right hip, initial encounter (HCC)   Incision is healed Full weight bearing without pain. Has some tendency to internal rotation.  Plan:  May weight bear as tolerated. Pool walking or short distance walking on level surfaces for exercise. Stationary bike for exercise.  Hip abduction exercises and flexion exercise. Hip Exercises Ask your health care provider which exercises are safe for you. Do exercises exactly as told by your health care provider and adjust them as directed. It is normal to feel mild stretching, pulling, tightness, or discomfort as you do these exercises. Stop right away if you feel sudden pain or your pain gets worse. Do not begin these exercises until told by your health care provider. Stretching and range-of-motion exercises These exercises warm up your muscles and joints and improve the movement and flexibility of your hip. These exercises also help to relieve pain, numbness, and tingling. You may be asked to limit your range of motion if you had a hip replacement. Talk to your health care provider about these restrictions. Hamstrings, supine  1. Lie on your back (supine position). 2. Loop a belt or towel over the ball of your left / right foot. The ball of your foot is on the walking surface, right under your toes. 3. Straighten your left / right knee and slowly pull on the belt or towel to raise your leg until you feel a gentle stretch behind your knee (hamstring). ? Do not let your knee bend while you do this. ? Keep your other leg flat on the floor. 4. Hold this position for __________  seconds. 5. Slowly return your leg to the starting position. Repeat __________ times. Complete this exercise __________ times a day. Hip rotation  1. Lie on your back on a firm surface. 2. With your left / right hand, gently pull your left / right knee toward the shoulder that is on the same side of the body. Stop when your knee is pointing toward the ceiling. 3. Hold your left / right ankle with your other hand. 4. Keeping your knee steady, gently pull your left / right ankle toward your other shoulder until you feel a stretch in your buttocks. ? Keep your hips and shoulders firmly planted while you do this stretch. 5. Hold this position for __________ seconds. Repeat __________ times. Complete this exercise __________ times a day. Seated stretch This exercise is sometimes called hamstrings and adductors stretch. 1. Sit on the floor with your legs stretched wide. Keep your knees straight during this exercise. 2. Keeping your head and back in a straight line, bend at your waist to reach for your left foot (position A). You should feel a stretch in your right inner thigh (adductors). 3. Hold this position for __________ seconds. Then slowly return to the upright position. 4. Keeping your head and back in a straight line, bend at your waist to reach forward (position B). You should feel a stretch behind both of your thighs and knees (hamstrings). 5. Hold this position for __________  seconds. Then slowly return to the upright position. 6. Keeping your head and back in a straight line, bend at your waist to reach for your right foot (position C). You should feel a stretch in your left inner thigh (adductors). 7. Hold this position for __________ seconds. Then slowly return to the upright position. Repeat __________ times. Complete this exercise __________ times a day. Lunge This exercise stretches the muscles of the hip (hip flexors). 1. Place your left / right knee on the floor and bend your  other knee so that is directly over your ankle. You should be half-kneeling. 2. Keep good posture with your head over your shoulders. 3. Tighten your buttocks to point your tailbone downward. This will prevent your back from arching too much. 4. You should feel a gentle stretch in the front of your left / right thigh and hip. If you do not feel a stretch, slide your other foot forward slightly and then slowly lunge forward with your chest up until your knee once again lines up over your ankle. ? Make sure your tailbone continues to point downward. 5. Hold this position for __________ seconds. 6. Slowly return to the starting position. Repeat __________ times. Complete this exercise __________ times a day. Strengthening exercises These exercises build strength and endurance in your hip. Endurance is the ability to use your muscles for a long time, even after they get tired. Bridge This exercise strengthens the muscles of your hip (hip extensors). 1. Lie on your back on a firm surface with your knees bent and your feet flat on the floor. 2. Tighten your buttocks muscles and lift your bottom off the floor until the trunk of your body and your hips are level with your thighs. ? Do not arch your back. ? You should feel the muscles working in your buttocks and the back of your thighs. If you do not feel these muscles, slide your feet 1-2 inches (2.5-5 cm) farther away from your buttocks. 3. Hold this position for __________ seconds. 4. Slowly lower your hips to the starting position. 5. Let your muscles relax completely between repetitions. Repeat __________ times. Complete this exercise __________ times a day. Straight leg raises, side-lying This exercise strengthens the muscles that move the hip joint away from the center of the body (hip abductors). 1. Lie on your side with your left / right leg in the top position. Lie so your head, shoulder, hip, and knee line up. You may bend your bottom knee  slightly to help you balance. 2. Roll your hips slightly forward, so your hips are stacked directly over each other and your left / right knee is facing forward. 3. Leading with your heel, lift your top leg 4-6 inches (10-15 cm). You should feel the muscles in your top hip lifting. ? Do not let your foot drift forward. ? Do not let your knee roll toward the ceiling. 4. Hold this position for __________ seconds. 5. Slowly return to the starting position. 6. Let your muscles relax completely between repetitions. Repeat __________ times. Complete this exercise __________ times a day. Straight leg raises, side-lying This exercise strengthens the muscles that move the hip joint toward the center of the body (hip adductors). 1. Lie on your side with your left / right leg in the bottom position. Lie so your head, shoulder, hip, and knee line up. You may place your upper foot in front to help you balance. 2. Roll your hips slightly forward, so your hips are  stacked directly over each other and your left / right knee is facing forward. 3. Tense the muscles in your inner thigh and lift your bottom leg 4-6 inches (10-15 cm). 4. Hold this position for __________ seconds. 5. Slowly return to the starting position. 6. Let your muscles relax completely between repetitions. Repeat __________ times. Complete this exercise __________ times a day. Straight leg raises, supine This exercise strengthens the muscles in the front of your thigh (quadriceps). 1. Lie on your back (supine position) with your left / right leg extended and your other knee bent. 2. Tense the muscles in the front of your left / right thigh. You should see your kneecap slide up or see increased dimpling just above your knee. 3. Keep these muscles tight as you raise your leg 4-6 inches (10-15 cm) off the floor. Do not let your knee bend. 4. Hold this position for __________ seconds. 5. Keep these muscles tense as you lower your leg. 6. Relax  the muscles slowly and completely between repetitions. Repeat __________ times. Complete this exercise __________ times a day. Hip abductors, standing This exercise strengthens the muscles that move the leg and hip joint away from the center of the body (hip abductors). 1. Tie one end of a rubber exercise band or tubing to a secure surface, such as a chair, table, or pole. 2. Loop the other end of the band or tubing around your left / right ankle. 3. Keeping your ankle with the band or tubing directly opposite the secured end, step away until there is tension in the tubing or band. Hold on to a chair, table, or pole as needed for balance. 4. Lift your left / right leg out to your side. While you do this: ? Keep your back upright. ? Keep your shoulders over your hips. ? Keep your toes pointing forward. ? Make sure to use your hip muscles to slowly lift your leg. Do not tip your body or forcefully lift your leg. 5. Hold this position for __________ seconds. 6. Slowly return to the starting position. Repeat __________ times. Complete this exercise __________ times a day. Squats This exercise strengthens the muscles in the front of your thigh (quadriceps). 1. Stand in a door frame so your feet and knees are in line with the frame. You may place your hands on the frame for balance. 2. Slowly bend your knees and lower your hips like you are going to sit in a chair. ? Keep your lower legs in a straight-up-and-down position. ? Do not let your hips go lower than your knees. ? Do not bend your knees lower than told by your health care provider. ? If your hip pain increases, do not bend as low. 3. Hold this position for ___________ seconds. 4. Slowly push with your legs to return to standing. Do not use your hands to pull yourself to standing. Repeat __________ times. Complete this exercise __________ times a day. This information is not intended to replace advice given to you by your health care  provider. Make sure you discuss any questions you have with your health care provider. Document Revised: 04/27/2019 Document Reviewed: 08/02/2018 Elsevier Patient Education  2020 [employer].   Follow-Up Instructions: No follow-ups on file.   Orders:  Orders Placed This Encounter  Procedures  . XR HIP UNILAT W OR W/O PELVIS 2-3 VIEWS RIGHT   No orders of the defined types were placed in this encounter.   Imaging: No results found.  PMFS History:  Patient Active Problem List   Diagnosis Date Noted  . Vitamin D deficiency 05/15/2020    Priority: Medium    Class: Chronic  . Closed displaced fracture of right femoral neck (HCC) 05/14/2020  . Hip fracture (HCC) 05/13/2020   Past Medical History:  Diagnosis Date  . Fractures   . Scoliosis     Family History  Problem Relation Age of Onset  . Cancer Father     Past Surgical History:  Procedure Laterality Date  . FOOT SURGERY    . HIP PINNING,CANNULATED Right 05/14/2020   Procedure: CANNULATED HIP PINNING;  Surgeon: Kerrin Champagne, MD;  Location: [facility];  Service: Orthopedics;  Laterality: Right;   Social History   Occupational History  . 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 Smoking status: Former Smoker    Packs/day: 1.00    Years: 10.00    Pack years: 10.00    Quit date: 05/14/1977    Years since quitting: 43.2  . Smokeless tobacco: Never Used  Vaping Use  . Vaping Use: Never used  Substance and Sexual Activity  . Alcohol use: Yes    Comment: social  . Drug use: Never  . Sexual activity: Not on file

## 2020-08-01 NOTE — Patient Instructions (Signed)
May weight bear as tolerated. Pool walking or short distance walking on level surfaces for exercise. Stationary bike for exercise.  Hip abduction exercises and flexion exercise. Hip Exercises Ask your health care provider which exercises are safe for you. Do exercises exactly as told by your health care provider and adjust them as directed. It is normal to feel mild stretching, pulling, tightness, or discomfort as you do these exercises. Stop right away if you feel sudden pain or your pain gets worse. Do not begin these exercises until told by your health care provider. Stretching and range-of-motion exercises These exercises warm up your muscles and joints and improve the movement and flexibility of your hip. These exercises also help to relieve pain, numbness, and tingling. You may be asked to limit your range of motion if you had a hip replacement. Talk to your health care provider about these restrictions. Hamstrings, supine  1. Lie on your back (supine position). 2. Loop a belt or towel over the ball of your left / right foot. The ball of your foot is on the walking surface, right under your toes. 3. Straighten your left / right knee and slowly pull on the belt or towel to raise your leg until you feel a gentle stretch behind your knee (hamstring). ? Do not let your knee bend while you do this. ? Keep your other leg flat on the floor. 4. Hold this position for __________ seconds. 5. Slowly return your leg to the starting position. Repeat __________ times. Complete this exercise __________ times a day. Hip rotation  1. Lie on your back on a firm surface. 2. With your left / right hand, gently pull your left / right knee toward the shoulder that is on the same side of the body. Stop when your knee is pointing toward the ceiling. 3. Hold your left / right ankle with your other hand. 4. Keeping your knee steady, gently pull your left / right ankle toward your other shoulder until you feel a  stretch in your buttocks. ? Keep your hips and shoulders firmly planted while you do this stretch. 5. Hold this position for __________ seconds. Repeat __________ times. Complete this exercise __________ times a day. Seated stretch This exercise is sometimes called hamstrings and adductors stretch. 1. Sit on the floor with your legs stretched wide. Keep your knees straight during this exercise. 2. Keeping your head and back in a straight line, bend at your waist to reach for your left foot (position A). You should feel a stretch in your right inner thigh (adductors). 3. Hold this position for __________ seconds. Then slowly return to the upright position. 4. Keeping your head and back in a straight line, bend at your waist to reach forward (position B). You should feel a stretch behind both of your thighs and knees (hamstrings). 5. Hold this position for __________ seconds. Then slowly return to the upright position. 6. Keeping your head and back in a straight line, bend at your waist to reach for your right foot (position C). You should feel a stretch in your left inner thigh (adductors). 7. Hold this position for __________ seconds. Then slowly return to the upright position. Repeat __________ times. Complete this exercise __________ times a day. Lunge This exercise stretches the muscles of the hip (hip flexors). 1. Place your left / right knee on the floor and bend your other knee so that is directly over your ankle. You should be half-kneeling. 2. Keep good posture with your  head over your shoulders. 3. Tighten your buttocks to point your tailbone downward. This will prevent your back from arching too much. 4. You should feel a gentle stretch in the front of your left / right thigh and hip. If you do not feel a stretch, slide your other foot forward slightly and then slowly lunge forward with your chest up until your knee once again lines up over your ankle. ? Make sure your tailbone  continues to point downward. 5. Hold this position for __________ seconds. 6. Slowly return to the starting position. Repeat __________ times. Complete this exercise __________ times a day. Strengthening exercises These exercises build strength and endurance in your hip. Endurance is the ability to use your muscles for a long time, even after they get tired. Bridge This exercise strengthens the muscles of your hip (hip extensors). 1. Lie on your back on a firm surface with your knees bent and your feet flat on the floor. 2. Tighten your buttocks muscles and lift your bottom off the floor until the trunk of your body and your hips are level with your thighs. ? Do not arch your back. ? You should feel the muscles working in your buttocks and the back of your thighs. If you do not feel these muscles, slide your feet 1-2 inches (2.5-5 cm) farther away from your buttocks. 3. Hold this position for __________ seconds. 4. Slowly lower your hips to the starting position. 5. Let your muscles relax completely between repetitions. Repeat __________ times. Complete this exercise __________ times a day. Straight leg raises, side-lying This exercise strengthens the muscles that move the hip joint away from the center of the body (hip abductors). 1. Lie on your side with your left / right leg in the top position. Lie so your head, shoulder, hip, and knee line up. You may bend your bottom knee slightly to help you balance. 2. Roll your hips slightly forward, so your hips are stacked directly over each other and your left / right knee is facing forward. 3. Leading with your heel, lift your top leg 4-6 inches (10-15 cm). You should feel the muscles in your top hip lifting. ? Do not let your foot drift forward. ? Do not let your knee roll toward the ceiling. 4. Hold this position for __________ seconds. 5. Slowly return to the starting position. 6. Let your muscles relax completely between repetitions. Repeat  __________ times. Complete this exercise __________ times a day. Straight leg raises, side-lying This exercise strengthens the muscles that move the hip joint toward the center of the body (hip adductors). 1. Lie on your side with your left / right leg in the bottom position. Lie so your head, shoulder, hip, and knee line up. You may place your upper foot in front to help you balance. 2. Roll your hips slightly forward, so your hips are stacked directly over each other and your left / right knee is facing forward. 3. Tense the muscles in your inner thigh and lift your bottom leg 4-6 inches (10-15 cm). 4. Hold this position for __________ seconds. 5. Slowly return to the starting position. 6. Let your muscles relax completely between repetitions. Repeat __________ times. Complete this exercise __________ times a day. Straight leg raises, supine This exercise strengthens the muscles in the front of your thigh (quadriceps). 1. Lie on your back (supine position) with your left / right leg extended and your other knee bent. 2. Tense the muscles in the front of your left /  right thigh. You should see your kneecap slide up or see increased dimpling just above your knee. 3. Keep these muscles tight as you raise your leg 4-6 inches (10-15 cm) off the floor. Do not let your knee bend. 4. Hold this position for __________ seconds. 5. Keep these muscles tense as you lower your leg. 6. Relax the muscles slowly and completely between repetitions. Repeat __________ times. Complete this exercise __________ times a day. Hip abductors, standing This exercise strengthens the muscles that move the leg and hip joint away from the center of the body (hip abductors). 1. Tie one end of a rubber exercise band or tubing to a secure surface, such as a chair, table, or pole. 2. Loop the other end of the band or tubing around your left / right ankle. 3. Keeping your ankle with the band or tubing directly opposite the  secured end, step away until there is tension in the tubing or band. Hold on to a chair, table, or pole as needed for balance. 4. Lift your left / right leg out to your side. While you do this: ? Keep your back upright. ? Keep your shoulders over your hips. ? Keep your toes pointing forward. ? Make sure to use your hip muscles to slowly lift your leg. Do not tip your body or forcefully lift your leg. 5. Hold this position for __________ seconds. 6. Slowly return to the starting position. Repeat __________ times. Complete this exercise __________ times a day. Squats This exercise strengthens the muscles in the front of your thigh (quadriceps). 1. Stand in a door frame so your feet and knees are in line with the frame. You may place your hands on the frame for balance. 2. Slowly bend your knees and lower your hips like you are going to sit in a chair. ? Keep your lower legs in a straight-up-and-down position. ? Do not let your hips go lower than your knees. ? Do not bend your knees lower than told by your health care provider. ? If your hip pain increases, do not bend as low. 3. Hold this position for ___________ seconds. 4. Slowly push with your legs to return to standing. Do not use your hands to pull yourself to standing. Repeat __________ times. Complete this exercise __________ times a day. This information is not intended to replace advice given to you by your health care provider. Make sure you discuss any questions you have with your health care provider. Document Revised: 04/27/2019 Document Reviewed: 08/02/2018 Elsevier Patient Education  2020 [employer].

## 2020-11-01 ENCOUNTER — Ambulatory Visit: Payer: Medicare Other | Admitting: Specialist

## 2020-11-01 ENCOUNTER — Encounter: Payer: Self-pay | Admitting: Specialist

## 2020-11-01 ENCOUNTER — Other Ambulatory Visit: Payer: Self-pay

## 2020-11-01 ENCOUNTER — Ambulatory Visit: Payer: Self-pay

## 2020-11-01 VITALS — BP 165/82 | HR 64 | Ht 72.0 in | Wt 185.0 lb

## 2020-11-01 DIAGNOSIS — S72001A Fracture of unspecified part of neck of right femur, initial encounter for closed fracture: Secondary | ICD-10-CM | POA: Diagnosis not present

## 2020-11-01 DIAGNOSIS — M2170 Unequal limb length (acquired), unspecified site: Secondary | ICD-10-CM | POA: Diagnosis not present

## 2020-11-01 NOTE — Progress Notes (Signed)
Office Visit Note   Patient: Richard Todd           Date of Birth: [birth year]           MRN: [id number] Visit Date: 11/01/2020              Requested by: No referring provider defined for this encounter. PCP: Pcp, No   Assessment & Plan: Visit Diagnoses:  1. Closed fracture of right hip, initial encounter (HCC)   2. Leg length discrepancy     Plan:  Hip Exercises Ask your health care provider which exercises are safe for you. Do exercises exactly as told by your health care provider and adjust them as directed. It is normal to feel mild stretching, pulling, tightness, or discomfort as you do these exercises. Stop right away if you feel sudden pain or your pain gets worse. Do not begin these exercises until told by your health care provider. Stretching and range-of-motion exercises These exercises warm up your muscles and joints and improve the movement and flexibility of your hip. These exercises also help to relieve pain, numbness, and tingling. You may be asked to limit your range of motion if you had a hip replacement. Talk to your health care provider about these restrictions. Hamstrings, supine 1. Lie on your back (supine position). 2. Loop a belt or towel over the ball of your left / right foot. The ball of your foot is on the walking surface, right under your toes. 3. Straighten your left / right knee and slowly pull on the belt or towel to raise your leg until you feel a gentle stretch behind your knee (hamstring). ? Do not let your knee bend while you do this. ? Keep your other leg flat on the floor. 4. Hold this position for ___10_______ seconds. 5. Slowly return your leg to the starting position. Repeat ____10______ times. Complete this exercise _____3_____ times a day.   Hip rotation 1. Lie on your back on a firm surface. 2. With your left / right hand, gently pull your left / right knee toward the shoulder that is on the same side of the body. Stop when your knee is  pointing toward the ceiling. 3. Hold your left / right ankle with your other hand. 4. Keeping your knee steady, gently pull your left / right ankle toward your other shoulder until you feel a stretch in your buttocks. ? Keep your hips and shoulders firmly planted while you do this stretch. 5. Hold this position for ____10______ seconds. Repeat ____10______ times. Complete this exercise ___10_______ times a day.   Seated stretch This exercise is sometimes called hamstrings and adductors stretch. 1. Sit on the floor with your legs stretched wide. Keep your knees straight during this exercise. 2. Keeping your head and back in a straight line, bend at your waist to reach for your left foot (position A). You should feel a stretch in your right inner thigh (adductors). 3. Hold this position for ____10______ seconds. Then slowly return to the upright position. 4. Keeping your head and back in a straight line, bend at your waist to reach forward (position B). You should feel a stretch behind both of your thighs and knees (hamstrings). 5. Hold this position for ___10_______ seconds. Then slowly return to the upright position. 6. Keeping your head and back in a straight line, bend at your waist to reach for your right foot (position C). You should feel a stretch in your left inner thigh (adductors).  7. Hold this position for ___10_______ seconds. Then slowly return to the upright position. Repeat _____10_____ times. Complete this exercise ___3_______ times a day.   Lunge This exercise stretches the muscles of the hip (hip flexors). 1. Place your left / right knee on the floor and bend your other knee so that is directly over your ankle. You should be half-kneeling. 2. Keep good posture with your head over your shoulders. 3. Tighten your buttocks to point your tailbone downward. This will prevent your back from arching too much. 4. You should feel a gentle stretch in the front of your left / right thigh  and hip. If you do not feel a stretch, slide your other foot forward slightly and then slowly lunge forward with your chest up until your knee once again lines up over your ankle. ? Make sure your tailbone continues to point downward. 5. Hold this position for ___10______ seconds. 6. Slowly return to the starting position. Repeat ___10_______ times. Complete this exercise ___3_______ times a day.   Strengthening exercises These exercises build strength and endurance in your hip. Endurance is the ability to use your muscles for a long time, even after they get tired. Bridge This exercise strengthens the muscles of your hip (hip extensors). 1. Lie on your back on a firm surface with your knees bent and your feet flat on the floor. 2. Tighten your buttocks muscles and lift your bottom off the floor until the trunk of your body and your hips are level with your thighs. ? Do not arch your back. ? You should feel the muscles working in your buttocks and the back of your thighs. If you do not feel these muscles, slide your feet 1-2 inches (2.5-5 cm) farther away from your buttocks. 3. Hold this position for _____10_____ seconds. 4. Slowly lower your hips to the starting position. 5. Let your muscles relax completely between repetitions. Repeat _____10_____ times. Complete this exercise __3________ times a day.   Straight leg raises, side-lying This exercise strengthens the muscles that move the hip joint away from the center of the body (hip abductors). 1. Lie on your side with your left / right leg in the top position. Lie so your head, shoulder, hip, and knee line up. You may bend your bottom knee slightly to help you balance. 2. Roll your hips slightly forward, so your hips are stacked directly over each other and your left / right knee is facing forward. 3. Leading with your heel, lift your top leg 4-6 inches (10-15 cm). You should feel the muscles in your top hip lifting. ? Do not let your foot  drift forward. ? Do not let your knee roll toward the ceiling. 4. Hold this position for ____10______ seconds. 5. Slowly return to the starting position. 6. Let your muscles relax completely between repetitions. Repeat ___10______ times. Complete this exercise ____3_____ times a day.   Straight leg raises, side-lying This exercise strengthens the muscles that move the hip joint toward the center of the body (hip adductors). 1. Lie on your side with your left / right leg in the bottom position. Lie so your head, shoulder, hip, and knee line up. You may place your upper foot in front to help you balance. 2. Roll your hips slightly forward, so your hips are stacked directly over each other and your left / right knee is facing forward. 3. Tense the muscles in your inner thigh and lift your bottom leg 4-6 inches (10-15 cm). 4. Hold  this position for ___10_______ seconds. 5. Slowly return to the starting position. 6. Let your muscles relax completely between repetitions. Repeat _____10____ times. Complete this exercise ___3_______ times a day.   Straight leg raises, supine This exercise strengthens the muscles in the front of your thigh (quadriceps). 1. Lie on your back (supine position) with your left / right leg extended and your other knee bent. 2. Tense the muscles in the front of your left / right thigh. You should see your kneecap slide up or see increased dimpling just above your knee. 3. Keep these muscles tight as you raise your leg 4-6 inches (10-15 cm) off the floor. Do not let your knee bend. 4. Hold this position for _______10___ seconds. 5. Keep these muscles tense as you lower your leg. 6. Relax the muscles slowly and completely between repetitions. Repeat ___10_______ times. Complete this exercise _____3_____ times a day.   Hip abductors, standing This exercise strengthens the muscles that move the leg and hip joint away from the center of the body (hip abductors). 1. Tie one end  of a rubber exercise band or tubing to a secure surface, such as a chair, table, or pole. 2. Loop the other end of the band or tubing around your left / right ankle. 3. Keeping your ankle with the band or tubing directly opposite the secured end, step away until there is tension in the tubing or band. Hold on to a chair, table, or pole as needed for balance. 4. Lift your left / right leg out to your side. While you do this: ? Keep your back upright. ? Keep your shoulders over your hips. ? Keep your toes pointing forward. ? Make sure to use your hip muscles to slowly lift your leg. Do not tip your body or forcefully lift your leg. 5. Hold this position for _____10_____ seconds. 6. Slowly return to the starting position. Repeat ______10____ times. Complete this exercise __3________ times a day. Squats This exercise strengthens the muscles in the front of your thigh (quadriceps). 1. Stand in a door frame so your feet and knees are in line with the frame. You may place your hands on the frame for balance. 2. Slowly bend your knees and lower your hips like you are going to sit in a chair. ? Keep your lower legs in a straight-up-and-down position. ? Do not let your hips go lower than your knees. ? Do not bend your knees lower than told by your health care provider. ? If your hip pain increases, do not bend as low. 3. Hold this position for _____10______ seconds. 4. Slowly push with your legs to return to standing. Do not use your hands to pull yourself to standing. Repeat ______10____ times. Complete this exercise _____3_____ times a day. This information is not intended to replace advice given to you by your health care provider. Make sure you discuss any questions you have with your health care provider. After 2 years if no development of arthritis then the likelihood of avascular necrosis is not a concern. Document Revised: 04/27/2019 Document Reviewed: 08/02/2018 Elsevier Patient Education   2021 [employer]  Follow-Up Instructions: Return if symptoms worsen or fail to improve.   Orders:  Orders Placed This Encounter  Procedures   XR HIP UNILAT W OR W/O PELVIS 2-3 VIEWS RIGHT   No orders of the defined types were placed in this encounter.     Procedures: No procedures performed   Clinical Data: No additional findings.  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 He underwent pinning of the left hip with gentle manipulation to normal postion and alignment May 14, 2009.   Review of Systems  Constitutional: Negative.   HENT: Negative.   Eyes: Negative.   Respiratory: Negative.   Cardiovascular: Negative.   Gastrointestinal: Negative.   Endocrine: Negative.   Genitourinary: Negative.   Musculoskeletal: Positive for back pain and gait problem.  Skin: Negative.   Allergic/Immunologic: Positive for environmental allergies.  Hematological: Negative.   Psychiatric/Behavioral: Negative.      Objective: Vital Signs: BP (!) 165/82 (BP Location: Left Arm, Patient Position: Sitting)    Pulse 64    Ht 6' (1.829 m)    Wt 185 lb (83.9 kg)    BMI 25.09 kg/m   Physical Exam Constitutional:      Appearance: He is well-developed and well-nourished.  HENT:     Head: Normocephalic and atraumatic.  Eyes:     Extraocular Movements: EOM normal.     Pupils: Pupils are equal, round, and reactive to light.  Pulmonary:     Effort: Pulmonary effort is normal.     Breath sounds: Normal breath sounds.  Abdominal:     General: Bowel sounds are normal.     Palpations: Abdomen is soft.  Musculoskeletal:        General: Normal range of motion.     Cervical back: Normal range of motion and neck supple.  Skin:    General: Skin is warm and dry.  Neurological:     Mental Status: He is alert and oriented to person, place, and  time.  Psychiatric:        Mood and Affect: Mood and affect normal.        Behavior: Behavior normal.        Thought Content: Thought content normal.        Judgment: Judgment normal.     Ortho Exam  Specialty Comments:  No specialty comments available.  Imaging: No results found.   PMFS History: Patient Active Problem List   Diagnosis Date Noted   Vitamin D deficiency 05/15/2020    Priority: Medium    Class: Chronic   Closed displaced fracture of right femoral neck (HCC) 05/14/2020   Hip fracture (HCC) 05/13/2020   Past Medical History:  Diagnosis Date   Fractures    Scoliosis     Family History  Problem Relation Age of Onset   Cancer Father     Past Surgical History:  Procedure Laterality Date   FOOT SURGERY     HIP PINNING,CANNULATED Right 05/14/2020   Procedure: CANNULATED HIP PINNING;  Surgeon: Kerrin Champagne, MD;  Location: [facility];  Service: Orthopedics;  Laterality: Right;   Social History   Occupational History   Occupation: Brewing technologist: Production assistant, radio FOR SELF EMPLOYED  Tobacco Use   Smoking status: Former Smoker    Packs/day: 1.00    Years: 10.00    Pack years: 10.00    Quit date: 05/14/1977    Years since quitting: 43.4   Smokeless tobacco: Never Used  Vaping Use   Vaping Use: Never used  Substance and Sexual Activity   Alcohol use: Yes    Comment: social   Drug use: Never   Sexual activity: Not on file

## 2020-11-01 NOTE — Patient Instructions (Addendum)
Hip Exercises Ask your health care provider which exercises are safe for you. Do exercises exactly as told by your health care provider and adjust them as directed. It is normal to feel mild stretching, pulling, tightness, or discomfort as you do these exercises. Stop right away if you feel sudden pain or your pain gets worse. Do not begin these exercises until told by your health care provider. Stretching and range-of-motion exercises These exercises warm up your muscles and joints and improve the movement and flexibility of your hip. These exercises also help to relieve pain, numbness, and tingling. You may be asked to limit your range of motion if you had a hip replacement. Talk to your health care provider about these restrictions. Hamstrings, supine 1. Lie on your back (supine position). 2. Loop a belt or towel over the ball of your left / right foot. The ball of your foot is on the walking surface, right under your toes. 3. Straighten your left / right knee and slowly pull on the belt or towel to raise your leg until you feel a gentle stretch behind your knee (hamstring). ? Do not let your knee bend while you do this. ? Keep your other leg flat on the floor. 4. Hold this position for ___10_______ seconds. 5. Slowly return your leg to the starting position. Repeat ____10______ times. Complete this exercise _____3_____ times a day.   Hip rotation 1. Lie on your back on a firm surface. 2. With your left / right hand, gently pull your left / right knee toward the shoulder that is on the same side of the body. Stop when your knee is pointing toward the ceiling. 3. Hold your left / right ankle with your other hand. 4. Keeping your knee steady, gently pull your left / right ankle toward your other shoulder until you feel a stretch in your buttocks. ? Keep your hips and shoulders firmly planted while you do this stretch. 5. Hold this position for ____10______ seconds. Repeat ____10______ times.  Complete this exercise ___10_______ times a day.   Seated stretch This exercise is sometimes called hamstrings and adductors stretch. 1. Sit on the floor with your legs stretched wide. Keep your knees straight during this exercise. 2. Keeping your head and back in a straight line, bend at your waist to reach for your left foot (position A). You should feel a stretch in your right inner thigh (adductors). 3. Hold this position for ____10______ seconds. Then slowly return to the upright position. 4. Keeping your head and back in a straight line, bend at your waist to reach forward (position B). You should feel a stretch behind both of your thighs and knees (hamstrings). 5. Hold this position for ___10_______ seconds. Then slowly return to the upright position. 6. Keeping your head and back in a straight line, bend at your waist to reach for your right foot (position C). You should feel a stretch in your left inner thigh (adductors). 7. Hold this position for ___10_______ seconds. Then slowly return to the upright position. Repeat _____10_____ times. Complete this exercise ___3_______ times a day.   Lunge This exercise stretches the muscles of the hip (hip flexors). 1. Place your left / right knee on the floor and bend your other knee so that is directly over your ankle. You should be half-kneeling. 2. Keep good posture with your head over your shoulders. 3. Tighten your buttocks to point your tailbone downward. This will prevent your back from arching too much. 4.  You should feel a gentle stretch in the front of your left / right thigh and hip. If you do not feel a stretch, slide your other foot forward slightly and then slowly lunge forward with your chest up until your knee once again lines up over your ankle. ? Make sure your tailbone continues to point downward. 5. Hold this position for ___10______ seconds. 6. Slowly return to the starting position. Repeat ___10_______ times. Complete this  exercise ___3_______ times a day.   Strengthening exercises These exercises build strength and endurance in your hip. Endurance is the ability to use your muscles for a long time, even after they get tired. Bridge This exercise strengthens the muscles of your hip (hip extensors). 1. Lie on your back on a firm surface with your knees bent and your feet flat on the floor. 2. Tighten your buttocks muscles and lift your bottom off the floor until the trunk of your body and your hips are level with your thighs. ? Do not arch your back. ? You should feel the muscles working in your buttocks and the back of your thighs. If you do not feel these muscles, slide your feet 1-2 inches (2.5-5 cm) farther away from your buttocks. 3. Hold this position for _____10_____ seconds. 4. Slowly lower your hips to the starting position. 5. Let your muscles relax completely between repetitions. Repeat _____10_____ times. Complete this exercise __3________ times a day.   Straight leg raises, side-lying This exercise strengthens the muscles that move the hip joint away from the center of the body (hip abductors). 1. Lie on your side with your left / right leg in the top position. Lie so your head, shoulder, hip, and knee line up. You may bend your bottom knee slightly to help you balance. 2. Roll your hips slightly forward, so your hips are stacked directly over each other and your left / right knee is facing forward. 3. Leading with your heel, lift your top leg 4-6 inches (10-15 cm). You should feel the muscles in your top hip lifting. ? Do not let your foot drift forward. ? Do not let your knee roll toward the ceiling. 4. Hold this position for ____10______ seconds. 5. Slowly return to the starting position. 6. Let your muscles relax completely between repetitions. Repeat ___10______ times. Complete this exercise ____3_____ times a day.   Straight leg raises, side-lying This exercise strengthens the muscles that  move the hip joint toward the center of the body (hip adductors). 1. Lie on your side with your left / right leg in the bottom position. Lie so your head, shoulder, hip, and knee line up. You may place your upper foot in front to help you balance. 2. Roll your hips slightly forward, so your hips are stacked directly over each other and your left / right knee is facing forward. 3. Tense the muscles in your inner thigh and lift your bottom leg 4-6 inches (10-15 cm). 4. Hold this position for ___10_______ seconds. 5. Slowly return to the starting position. 6. Let your muscles relax completely between repetitions. Repeat _____10____ times. Complete this exercise ___3_______ times a day.   Straight leg raises, supine This exercise strengthens the muscles in the front of your thigh (quadriceps). 1. Lie on your back (supine position) with your left / right leg extended and your other knee bent. 2. Tense the muscles in the front of your left / right thigh. You should see your kneecap slide up or see increased dimpling just  above your knee. 3. Keep these muscles tight as you raise your leg 4-6 inches (10-15 cm) off the floor. Do not let your knee bend. 4. Hold this position for _______10___ seconds. 5. Keep these muscles tense as you lower your leg. 6. Relax the muscles slowly and completely between repetitions. Repeat ___10_______ times. Complete this exercise _____3_____ times a day.   Hip abductors, standing This exercise strengthens the muscles that move the leg and hip joint away from the center of the body (hip abductors). 1. Tie one end of a rubber exercise band or tubing to a secure surface, such as a chair, table, or pole. 2. Loop the other end of the band or tubing around your left / right ankle. 3. Keeping your ankle with the band or tubing directly opposite the secured end, step away until there is tension in the tubing or band. Hold on to a chair, table, or pole as needed for  balance. 4. Lift your left / right leg out to your side. While you do this: ? Keep your back upright. ? Keep your shoulders over your hips. ? Keep your toes pointing forward. ? Make sure to use your hip muscles to slowly lift your leg. Do not tip your body or forcefully lift your leg. 5. Hold this position for _____10_____ seconds. 6. Slowly return to the starting position. Repeat ______10____ times. Complete this exercise __3________ times a day. Squats This exercise strengthens the muscles in the front of your thigh (quadriceps). 1. Stand in a door frame so your feet and knees are in line with the frame. You may place your hands on the frame for balance. 2. Slowly bend your knees and lower your hips like you are going to sit in a chair. ? Keep your lower legs in a straight-up-and-down position. ? Do not let your hips go lower than your knees. ? Do not bend your knees lower than told by your health care provider. ? If your hip pain increases, do not bend as low. 3. Hold this position for _____10______ seconds. 4. Slowly push with your legs to return to standing. Do not use your hands to pull yourself to standing. Repeat ______10____ times. Complete this exercise _____3_____ times a day. This information is not intended to replace advice given to you by your health care provider. Make sure you discuss any questions you have with your health care provider. After 2 years if no development of arthritis then the likelihood of avascular necrosis is not a concern. Document Revised: 04/27/2019 Document Reviewed: 08/02/2018 Elsevier Patient Education  2021 [employer] Rx for right one inch shoe lift.

## 2021-03-28 ENCOUNTER — Other Ambulatory Visit: Payer: Self-pay | Admitting: Specialist

## 2021-03-28 MED ORDER — CYCLOBENZAPRINE HCL 10 MG PO TABS: 10.0000 mg | ORAL_TABLET | Freq: Three times a day (TID) | ORAL | 0 refills | Status: AC | PRN

## 2021-04-01 ENCOUNTER — Telehealth: Payer: Self-pay | Admitting: Specialist

## 2021-04-01 ENCOUNTER — Ambulatory Visit: Payer: Medicare Other | Admitting: Specialist

## 2021-04-01 NOTE — Telephone Encounter (Signed)
Pt called stating he is not feeling well and did not have transportation for his appt today. Pt says he left a message for Dr. Otelia Sergeant (but did not see it on mychart on my end) so pt wants to know how he needs to be rescheduled and speak to the nurse on what to do inbetween. The best call back number is [phone].

## 2021-04-02 NOTE — Telephone Encounter (Signed)
Dr. Otelia Sergeant called and spoke with patient last night

## 2021-04-08 ENCOUNTER — Ambulatory Visit (INDEPENDENT_AMBULATORY_CARE_PROVIDER_SITE_OTHER): Payer: Medicare Other | Admitting: Specialist

## 2021-04-08 ENCOUNTER — Encounter: Payer: Self-pay | Admitting: Specialist

## 2021-04-08 ENCOUNTER — Ambulatory Visit: Payer: Self-pay

## 2021-04-08 ENCOUNTER — Other Ambulatory Visit: Payer: Self-pay

## 2021-04-08 VITALS — BP 139/71 | HR 70 | Ht 72.0 in | Wt 180.0 lb

## 2021-04-08 DIAGNOSIS — M7071 Other bursitis of hip, right hip: Secondary | ICD-10-CM | POA: Diagnosis not present

## 2021-04-08 DIAGNOSIS — M546 Pain in thoracic spine: Secondary | ICD-10-CM

## 2021-04-08 DIAGNOSIS — J942 Hemothorax: Secondary | ICD-10-CM

## 2021-04-08 DIAGNOSIS — S72001A Fracture of unspecified part of neck of right femur, initial encounter for closed fracture: Secondary | ICD-10-CM

## 2021-04-08 DIAGNOSIS — S2242XA Multiple fractures of ribs, left side, initial encounter for closed fracture: Secondary | ICD-10-CM | POA: Diagnosis not present

## 2021-04-08 MED ORDER — TRAMADOL HCL 50 MG PO TABS: 50.0000 mg | ORAL_TABLET | Freq: Four times a day (QID) | ORAL | 0 refills | Status: AC | PRN

## 2021-04-08 NOTE — Patient Instructions (Addendum)
Rib belt or ACE wrap about the thorax will provide support. You want to deep breath to keep the lung open. Tramadol for pain if necessary. Heat is okay for pain or ice.  Expect pain relief in next 3-4 weeks. If cough is becoming frequent or any fever or chills contact us or go to an ER for further evaluation as pneumonia can Be a sequelae of multiple rib injuries and can in this case cause infection in areas of hemothorax or atelectasis.

## 2021-04-08 NOTE — Progress Notes (Signed)
Office Visit Note   Patient: Richard Todd           Date of Birth: [birth year]           MRN: [id number] Visit Date: 04/08/2021              Requested by: No referring provider defined for this encounter. PCP: Pcp, No   Assessment & Plan: Visit Diagnoses:  1. Left-sided thoracic back pain, unspecified chronicity   2. Closed fracture of right hip, initial encounter (HCC)   3. Closed fracture of multiple ribs of left side, initial encounter   4. Other bursitis of hip, right hip     Plan: Rib belt or ACE wrap about the thorax will provide support. You want to deep breath to keep the lung open. Tramadol for pain if necessary. Heat is okay for pain or ice. Expect pain relief in next 3-4 weeks.  If cough is becoming frequent or any fever or chills contact us or go to an ER for further evaluation as pneumonia can Be a sequelae of multiple rib injuries and can in this case cause infection in areas of hemothorax or atelectasis.   Follow-Up Instructions: Return in about 4 weeks (around 05/06/2021).   Orders:  Orders Placed This Encounter  Procedures   XR Ribs Unilateral Left   XR Thoracic Spine 2 View   XR HIP UNILAT W OR W/O PELVIS 2-3 VIEWS RIGHT   No orders of the defined types were placed in this encounter.     Procedures: No procedures performed   Clinical Data: No additional findings.   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 He had immediate pain and was unable to move well. Eventually he got himself up and went to his house and to bed. He has remained at bedrest mainly for about 9 days and has begun to move Better, getting out of bed and to his shop to work on [employer]. He was  offered to come into the office one week ago but refused due to inability to get here  Due to discomfort. He originally could inhale without much discomfort then Inhaled some dust while sanding some wood and had a cough that worsened the discomfort. His pain is persisting over the left posterior chest. He has pain with deep breathing and cough but otherwise is not taking more than tylenol and ibuprofen and has no fever, chills or SOB.    Review of Systems  Constitutional: Negative.   HENT: Negative.    Eyes: Negative.   Respiratory:  Negative for apnea, cough, choking, chest tightness, shortness of breath, wheezing and stridor.   Cardiovascular:  Positive for chest pain. Negative for palpitations and leg swelling.  Gastrointestinal: Negative.  Negative for abdominal distention, abdominal pain, anal bleeding, blood in stool, constipation, diarrhea, nausea, rectal pain and vomiting.  Endocrine: Negative.   Genitourinary: Negative.   Musculoskeletal:  Positive for back pain. Negative for arthralgias, gait problem, joint swelling, myalgias, neck pain and neck stiffness.  Skin: Negative.  Negative for color change, pallor, rash and wound.  Allergic/Immunologic: Negative.   Neurological: Negative.   Hematological:  Negative for adenopathy.  Psychiatric/Behavioral: Negative.      Objective: Vital Signs: BP 139/71 (BP Location: Left Arm, Patient Position: Sitting)   Pulse 70   Ht 6' (1.829 m)   Wt 180 lb (81.6 kg)   BMI 24.41 kg/m   Physical Exam Constitutional:      Appearance: He is well-developed.  HENT:     Head: Normocephalic and atraumatic.  Eyes:     Pupils: Pupils are equal, round, and reactive to light.  Pulmonary:     Effort: Pulmonary effort is normal.     Breath sounds: Normal breath sounds.  Abdominal:     General: Bowel sounds are normal.     Palpations: Abdomen is soft.  Musculoskeletal:        General: Normal range of motion.     Cervical back: Normal range of  motion and neck supple.     Lumbar back: Negative right straight leg raise test and negative left straight leg raise test.  Skin:    General: Skin is warm and dry.  Neurological:     Mental Status: He is alert and oriented to person, place, and time.  Psychiatric:        Behavior: Behavior normal.        Thought Content: Thought content normal.        Judgment: Judgment normal.   Back Exam   Tenderness  The patient is experiencing tenderness in the thoracic.  Range of Motion  Extension:  normal  Flexion:  normal  Lateral bend right:  normal  Lateral bend left:  normal  Rotation right:  normal  Rotation left:  normal   Muscle Strength  Right Quadriceps:  5/5  Left Quadriceps:  5/5  Right Hamstrings:  5/5  Left Hamstrings:  5/5   Tests  Straight leg raise right: negative Straight leg raise left: negative  Reflexes  Patellar:  normal Achilles:  normal Biceps:  normal Babinski's sign: normal   Other  Toe walk: normal Heel walk: normal Sensation: normal Gait: normal  Erythema: no back redness Scars: absent  Comments:  Tender left inferior angle of scapula and over left posterior chest wall midscapula line No obvious swelling. Tender but no erythrema.     Specialty Comments:  No specialty comments available.  Imaging: No results found.   PMFS History: Patient Active Problem List   Diagnosis Date Noted   Vitamin D deficiency 05/15/2020    Priority: Medium    Class: Chronic   Closed displaced fracture of right femoral neck (HCC) 05/14/2020   Hip fracture (HCC) 05/13/2020   Past Medical History:  Diagnosis Date   Fractures    Scoliosis     Family History  Problem Relation Age of Onset   Cancer Father     Past Surgical History:  Procedure Laterality Date   FOOT SURGERY     HIP PINNING,CANNULATED Right 05/14/2020   Procedure: CANNULATED HIP PINNING;  Surgeon: Kerrin Champagne, MD;  Location: [facility];  Service: Orthopedics;  Laterality: Right;    Social History   Occupational History   Occupation: Brewing technologist: Production assistant, 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

## 2021-05-14 ENCOUNTER — Ambulatory Visit (INDEPENDENT_AMBULATORY_CARE_PROVIDER_SITE_OTHER): Payer: Medicare Other | Admitting: Specialist

## 2021-05-14 ENCOUNTER — Ambulatory Visit: Payer: Self-pay

## 2021-05-14 ENCOUNTER — Encounter: Payer: Self-pay | Admitting: Specialist

## 2021-05-14 ENCOUNTER — Other Ambulatory Visit: Payer: Self-pay

## 2021-05-14 VITALS — BP 131/78 | HR 63 | Ht 72.0 in | Wt 180.0 lb

## 2021-05-14 DIAGNOSIS — M2170 Unequal limb length (acquired), unspecified site: Secondary | ICD-10-CM | POA: Diagnosis not present

## 2021-05-14 DIAGNOSIS — M7061 Trochanteric bursitis, right hip: Secondary | ICD-10-CM

## 2021-05-14 DIAGNOSIS — M546 Pain in thoracic spine: Secondary | ICD-10-CM

## 2021-05-14 DIAGNOSIS — J942 Hemothorax: Secondary | ICD-10-CM

## 2021-05-14 DIAGNOSIS — S72001A Fracture of unspecified part of neck of right femur, initial encounter for closed fracture: Secondary | ICD-10-CM | POA: Diagnosis not present

## 2021-05-14 DIAGNOSIS — R29898 Other symptoms and signs involving the musculoskeletal system: Secondary | ICD-10-CM

## 2021-05-14 NOTE — Progress Notes (Signed)
Office Visit Note   Patient: Richard Todd           Date of Birth: [birth year]           MRN: [id number] Visit Date: 05/14/2021              Requested by: No referring provider defined for this encounter. PCP: Pcp, No   Assessment & Plan: Visit Diagnoses:  1. Left-sided thoracic back pain, unspecified chronicity   2. Closed fracture of right hip, initial encounter (HCC)   3. Hemothorax on left   4. Leg length discrepancy   5. Trochanteric bursitis, right hip   6. Weakness of right hip     Plan: Fall Prevention and Home Safety Falls cause injuries and can affect all age groups. It is possible to use preventive measures to significantly decrease the likelihood of falls. There are many simple measures which can make your home safer and prevent falls. OUTDOORS Repair cracks and edges of walkways and driveways. Remove high doorway thresholds. Trim shrubbery on the main path into your home. Have good outside lighting. Clear walkways of tools, rocks, debris, and clutter. Check that handrails are not broken and are securely fastened. Both sides of steps should have handrails. Have leaves, snow, and ice cleared regularly. Use sand or salt on walkways during winter months. In the garage, clean up grease or oil spills. BATHROOM Install night lights. Install grab bars by the toilet and in the tub and shower. Use non-skid mats or decals in the tub or shower. Place a plastic non-slip stool in the shower to sit on, if needed. Keep floors dry and clean up all water on the floor immediately. Remove soap buildup in the tub or shower on a regular basis. Secure bath mats with non-slip, double-sided rug tape. Remove throw rugs and tripping hazards from the floors. BEDROOMS Install night lights. Make sure a bedside light is easy to reach. Do not use oversized bedding. Keep a telephone by your bedside. Have a firm chair with side arms to use for getting dressed. Remove throw rugs and  tripping hazards from the floor. KITCHEN Keep handles on pots and pans turned toward the center of the stove. Use back burners when possible. Clean up spills quickly and allow time for drying. Avoid walking on wet floors. Avoid hot utensils and knives. Position shelves so they are not too high or low. Place commonly used objects within easy reach. If necessary, use a sturdy step stool with a grab bar when reaching. Keep electrical cables out of the way. Do not use floor polish or wax that makes floors slippery. If you must use wax, use non-skid floor wax. Remove throw rugs and tripping hazards from the floor. STAIRWAYS Never leave objects on stairs. Place handrails on both sides of stairways and use them. Fix any loose handrails. Make sure handrails on both sides of the stairways are as long as the stairs. Check carpeting to make sure it is firmly attached along stairs. Make repairs to worn or loose carpet promptly. Avoid placing throw rugs at the top or bottom of stairways, or properly secure the rug with carpet tape to prevent slippage. Get rid of throw rugs, if possible. Have an electrician put in a light switch at the top and bottom of the stairs. OTHER FALL PREVENTION TIPS Wear low-heel or rubber-soled shoes that are supportive and fit well. Wear closed toe shoes. When using a stepladder, make sure it is fully opened and both spreaders are  firmly locked. Do not climb a closed stepladder. Add color or contrast paint or tape to grab bars and handrails in your home. Place contrasting color strips on first and last steps. Learn and use mobility aids as needed. Install an electrical emergency response system. Turn on lights to avoid dark areas. Replace light bulbs that burn out immediately. Get light switches that glow. Arrange furniture to create clear pathways. Keep furniture in the same place. Firmly attach carpet with non-skid or double-sided tape. Eliminate uneven floor  surfaces. Select a carpet pattern that does not visually hide the edge of steps. Be aware of all pets. OTHER HOME SAFETY TIPS Set the water temperature for 120 F (48.8 C). Keep emergency numbers on or near the telephone. Keep smoke detectors on every level of the home and near sleeping areas. Document Released: 09/11/2002 Document Revised: 03/22/2012 Document Reviewed: 12/11/2011 [facility] Patient Information 2014 [place], [place].   Referral to physical therapy for right hip abduction strengthening, right greater trochanteric bursitis.   Follow-Up Instructions: Return in about 4 weeks (around 06/11/2021).   Orders:  Orders Placed This Encounter  Procedures   XR HIP UNILAT W OR W/O PELVIS 2-3 VIEWS RIGHT   XR Chest 2 View   No orders of the defined types were placed in this encounter.     Procedures: No procedures performed   Clinical Data: No additional findings.   Subjective: Chief Complaint  Patient presents with   Middle Back - Follow-up, Fracture    Follow up left rib fractures--states that they are so much better and getting better everyday. Standing to long he does get some discomfort in the back but not bad.    HPI  Review of Systems   Objective: Vital Signs: BP 131/78 (BP Location: Left Arm, Patient Position: Sitting)   Pulse 63   Ht 6' (1.829 m)   Wt 180 lb (81.6 kg)   BMI 24.41 kg/m   Physical Exam  Ortho Exam  Specialty Comments:  No specialty comments available.  Imaging: No results found.   PMFS History: Patient Active Problem List   Diagnosis Date Noted   Vitamin D deficiency 05/15/2020    Priority: Medium    Class: Chronic   Closed displaced fracture of right femoral neck (HCC) 05/14/2020   Hip fracture (HCC) 05/13/2020   Past Medical History:  Diagnosis Date   Fractures    Scoliosis     Family History  Problem Relation Age of Onset   Cancer Father     Past Surgical History:  Procedure Laterality Date   FOOT SURGERY      HIP PINNING,CANNULATED Right 05/14/2020   Procedure: CANNULATED HIP PINNING;  Surgeon: Kerrin Champagne, MD;  Location: [facility];  Service: Orthopedics;  Laterality: Right;   Social History   Occupational History   Occupation: Brewing technologist: Production assistant, radio FOR SELF EMPLOYED  Tobacco Use   Smoking status: Former    Packs/day: 1.00    Years: 10.00    Pack years: 10.00    Types: Cigarettes    Quit date: 05/14/1977    Years since quitting: 44.0   Smokeless tobacco: Never  Vaping Use   Vaping Use: Never used  Substance and Sexual Activity   Alcohol use: Yes    Comment: social   Drug use: Never   Sexual activity: Not on file

## 2021-05-14 NOTE — Patient Instructions (Signed)
Fall Prevention and Home Safety Falls cause injuries and can affect all age groups. It is possible to use preventive measures to significantly decrease the likelihood of falls. There are many simple measures which can make your home safer and prevent falls. OUTDOORS Repair cracks and edges of walkways and driveways. Remove high doorway thresholds. Trim shrubbery on the main path into your home. Have good outside lighting. Clear walkways of tools, rocks, debris, and clutter. Check that handrails are not broken and are securely fastened. Both sides of steps should have handrails. Have leaves, snow, and ice cleared regularly. Use sand or salt on walkways during winter months. In the garage, clean up grease or oil spills. BATHROOM Install night lights. Install grab bars by the toilet and in the tub and shower. Use non-skid mats or decals in the tub or shower. Place a plastic non-slip stool in the shower to sit on, if needed. Keep floors dry and clean up all water on the floor immediately. Remove soap buildup in the tub or shower on a regular basis. Secure bath mats with non-slip, double-sided rug tape. Remove throw rugs and tripping hazards from the floors. BEDROOMS Install night lights. Make sure a bedside light is easy to reach. Do not use oversized bedding. Keep a telephone by your bedside. Have a firm chair with side arms to use for getting dressed. Remove throw rugs and tripping hazards from the floor. KITCHEN Keep handles on pots and pans turned toward the center of the stove. Use back burners when possible. Clean up spills quickly and allow time for drying. Avoid walking on wet floors. Avoid hot utensils and knives. Position shelves so they are not too high or low. Place commonly used objects within easy reach. If necessary, use a sturdy step stool with a grab bar when reaching. Keep electrical cables out of the way. Do not use floor polish or wax that makes floors slippery.  If you must use wax, use non-skid floor wax. Remove throw rugs and tripping hazards from the floor. STAIRWAYS Never leave objects on stairs. Place handrails on both sides of stairways and use them. Fix any loose handrails. Make sure handrails on both sides of the stairways are as long as the stairs. Check carpeting to make sure it is firmly attached along stairs. Make repairs to worn or loose carpet promptly. Avoid placing throw rugs at the top or bottom of stairways, or properly secure the rug with carpet tape to prevent slippage. Get rid of throw rugs, if possible. Have an electrician put in a light switch at the top and bottom of the stairs. OTHER FALL PREVENTION TIPS Wear low-heel or rubber-soled shoes that are supportive and fit well. Wear closed toe shoes. When using a stepladder, make sure it is fully opened and both spreaders are firmly locked. Do not climb a closed stepladder. Add color or contrast paint or tape to grab bars and handrails in your home. Place contrasting color strips on first and last steps. Learn and use mobility aids as needed. Install an electrical emergency response system. Turn on lights to avoid dark areas. Replace light bulbs that burn out immediately. Get light switches that glow. Arrange furniture to create clear pathways. Keep furniture in the same place. Firmly attach carpet with non-skid or double-sided tape. Eliminate uneven floor surfaces. Select a carpet pattern that does not visually hide the edge of steps. Be aware of all pets. OTHER HOME SAFETY TIPS Set the water temperature for 120 F (48.8 C). Keep  emergency numbers on or near the telephone. Keep smoke detectors on every level of the home and near sleeping areas. Document Released: 09/11/2002 Document Revised: 03/22/2012 Document Reviewed: 12/11/2011 [facility] Patient Information 2014 [place], [place].   Referral to physical therapy for right hip abduction strengthening, right greater trochanteric  bursitis.

## 2021-05-20 ENCOUNTER — Ambulatory Visit: Payer: Medicare Other | Attending: Specialist | Admitting: Rehabilitative and Restorative Service Providers"

## 2021-05-20 ENCOUNTER — Encounter: Payer: Self-pay | Admitting: Rehabilitative and Restorative Service Providers"

## 2021-05-20 ENCOUNTER — Other Ambulatory Visit: Payer: Self-pay

## 2021-05-20 DIAGNOSIS — R2689 Other abnormalities of gait and mobility: Secondary | ICD-10-CM

## 2021-05-20 DIAGNOSIS — R262 Difficulty in walking, not elsewhere classified: Secondary | ICD-10-CM | POA: Diagnosis present

## 2021-05-20 DIAGNOSIS — M546 Pain in thoracic spine: Secondary | ICD-10-CM | POA: Diagnosis not present

## 2021-05-20 DIAGNOSIS — R252 Cramp and spasm: Secondary | ICD-10-CM

## 2021-05-20 DIAGNOSIS — M25551 Pain in right hip: Secondary | ICD-10-CM | POA: Diagnosis present

## 2021-05-20 DIAGNOSIS — M6281 Muscle weakness (generalized): Secondary | ICD-10-CM

## 2021-05-20 NOTE — Patient Instructions (Signed)
Access Code: [email or web address] URL: [email or web address] Date: 05/20/2021 Prepared by: Clydie Braun [place]  Exercises Supine Hamstring Stretch - 2 x daily - 7 x weekly - 1 sets - 4 reps - 20 sec hold Supine Piriformis Stretch with Foot on Ground - 2 x daily - 7 x weekly - 1 sets - 4 reps - 20 sec hold Supine Bridge - 2 x daily - 7 x weekly - 2 sets - 10 reps Clamshell - 2 x daily - 7 x weekly - 2 sets - 10 reps

## 2021-05-20 NOTE — Therapy (Signed)
[facility] [facility] [address] [place], [place], [address] Phone: [phone]   Fax:  [phone]  Physical Therapy Evaluation  Patient Details  Name: Richard Todd MRN: [id number] Date of Birth: [birth year] Referring Provider (PT): Dr Vira Browns   Encounter Date: 05/20/2021   PT End of Session - 05/20/21 1202     Visit Number 1    Date for PT Re-Evaluation 07/11/21    PT Start Time 1100    PT Stop Time 1145    PT Time Calculation (min) 45 min    Activity Tolerance Patient tolerated treatment well    Behavior During Therapy [facility] for tasks assessed/performed             Past Medical History:  Diagnosis Date   Fractures    Scoliosis     Past Surgical History:  Procedure Laterality Date   FOOT SURGERY     HIP PINNING,CANNULATED Right 05/14/2020   Procedure: CANNULATED HIP PINNING;  Surgeon: Kerrin Champagne, MD;  Location: [facility];  Service: Orthopedics;  Laterality: Right;    There were no vitals filed for this visit.    Subjective Assessment - 05/20/21 1107     Subjective Pt reports that he had a R hip pinning a year ago and he is still having some weakness from that.  He states that he fell approx 8 weeks ago where he sustained some rib fractures.  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    Pertinent History R hip pinning 05/14/21, Recent falls    Limitations Walking    Diagnostic tests Radiographs: leg length discrepancy, negative for Fx    Patient Stated Goals Pt wants to be able to continue pain free active lifestyle.    Currently in Pain? Yes    Pain Score 3     Pain Location Hip    Pain Orientation Right    Pain Descriptors / Indicators Discomfort    Pain Type Chronic pain    Pain Onset More than a month ago    Multiple Pain Sites Yes    Pain Score 3    Pain Location Rib cage    Pain Orientation Left    Pain Descriptors / Indicators Pressure    Pain Type Chronic  pain    Pain Onset More than a month ago    Pain Frequency Intermittent                [facility] PT Assessment - 05/20/21 0001       Assessment   Medical Diagnosis Thoracic Pain, Rib Fx, R hip pain    Referring Provider (PT) Dr Vira Browns    Hand Dominance Right    Prior Therapy A year ago after R hip pinning      Precautions   Precautions Fall      Restrictions   Weight Bearing Restrictions No      Balance Screen   Has the patient fallen in the past 6 months Yes    How many times? 1    Has the patient had a decrease in activity level because of a fear of falling?  Yes    Is the patient reluctant to leave their home because of a fear of falling?  No      Home Tourist information centre manager residence    Living Arrangements Spouse/significant other    Type of Home House  Home Access Stairs to enter    Home Layout One level    Home Equipment [place] - single point;Shower seat;Toilet riser;Grab bars - tub/shower      Prior Function   Level of Independence Independent    Vocation --    IT sales professional, landscaping      Cognition   Overall Cognitive Status Within Functional Limits for tasks assessed      Observation/Other Assessments   Focus on Therapeutic Outcomes (FOTO)  56%      ROM / Strength   AROM / PROM / Strength Strength      Strength   Overall Strength Comments RLE strength of grossly 4/5 throughout      Transfers   Five time sit to stand comments  19 sec      Standardized Balance Assessment   Standardized Balance Assessment Berg Balance Test      Berg Balance Test   Sit to Stand Able to stand without using hands and stabilize independently    Standing Unsupported Able to stand safely 2 minutes    Sitting with Back Unsupported but Feet Supported on Floor or Stool Able to sit safely and securely 2 minutes    Stand to Sit Sits safely with minimal use of hands    Transfers Able to transfer  safely, minor use of hands    Standing Unsupported with Eyes Closed Able to stand 10 seconds with supervision    Standing Unsupported with Feet Together Able to place feet together independently and stand for 1 minute with supervision    From Standing, Reach Forward with Outstretched Arm Can reach forward >12 cm safely (5")    From Standing Position, Pick up Object from Floor Able to pick up shoe, needs supervision    From Standing Position, Turn to Look Behind Over each Shoulder Looks behind one side only/other side shows less weight shift    Turn 360 Degrees Able to turn 360 degrees safely one side only in 4 seconds or less    Standing Unsupported, Alternately Place Feet on Step/Stool Able to complete >2 steps/needs minimal assist    Standing Unsupported, One Foot in Front Able to plae foot ahead of the other independently and hold 30 seconds    Standing on One Leg Able to lift leg independently and hold equal to or more than 3 seconds    Total Score 44                        Objective measurements completed on examination: See above findings.               PT Education - 05/20/21 1145     Education Details Pt provided with HEP    Person(s) Educated Patient    Methods Explanation;Demonstration;Handout    Comprehension Verbalized understanding              PT Short Term Goals - 05/20/21 1328       PT SHORT TERM GOAL #1   Title Pt will be independent with initial HEP.    Time 2    Period Weeks    Status New               PT Long Term Goals - 05/20/21 1328       PT LONG TERM GOAL #1   Title Pt will be independent with advanced HEP.    Time 8  Period Weeks    Status New      PT LONG TERM GOAL #2   Title Pt will increase R hip strength to at least 4+/5 to allow him to negotiate steps with a reciprocal step pattern.    Time 8    Period Weeks    Status New      PT LONG TERM GOAL #3   Title Patient will increase BERG to at least 50/56  to place him at a lower risk of falling.    Time 8    Period Weeks    Status New      PT LONG TERM GOAL #4   Title Patient will demonstrate good understanding of posture and body mechanics.    Time 8    Period Weeks    Status New                    Plan - 05/20/21 1204     Clinical Impression Statement Pt is a 75 yo male who was referred by Dr Vira BrownsJames Nitka secondary to R hip weakness and recent fall with rib fractures and resulting thoracic pain.  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  He presents with BERG of 44/56 to place him at a higher fall risk.  Additionally, he has RLE muscle weakness and dificulty performing tasks that require R single leg stance, such as negotiating steps with reciprocal step pattern.  Patient has scoliosis and RLE shorter than LLE.  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 Richard Todd would benefit from skilled PT to address his functional impairments to allow him to be safer and more independent and decrease his risk of falling.    Personal Factors and Comorbidities Comorbidity 2    Comorbidities Scoliosis, leg length discrepancy    Examination-Activity Limitations Stairs;Locomotion Level    Examination-Participation Restrictions Yard Work    Stability/Clinical Decision Making Evolving/Moderate complexity    Clinical Decision Making Moderate    Rehab Potential Good    PT Frequency 2x / week    PT Duration 8 weeks    PT Treatment/Interventions ADLs/Self Care Home Management;Cryotherapy;Electrical Stimulation;Iontophoresis 4mg /ml Dexamethasone;Moist Heat;Traction;Ultrasound;Gait training;Stair training;Functional mobility training;Therapeutic activities;Therapeutic exercise;Balance training;Neuromuscular re-education;Patient/family education;Manual techniques;Passive range of motion;Dry  needling;Taping;Vasopneumatic Device;Spinal Manipulations;Joint Manipulations    PT Next Visit Plan strengthening, core stability, balance, flexibility    PT Home Exercise Plan Access Code: [email or web address]    Consulted and Agree with Plan of Care Patient             Patient will benefit from skilled therapeutic intervention in order to improve the following deficits and impairments:  Decreased balance, Difficulty walking, Increased muscle spasms, Decreased strength, Impaired flexibility, Pain, Postural dysfunction  Visit Diagnosis: Pain in thoracic spine - Plan: PT plan of care cert/re-cert  Pain in right hip - Plan: PT plan of care cert/re-cert  Muscle weakness (generalized) - Plan: PT plan of care cert/re-cert  Difficulty in walking, not elsewhere classified - Plan: PT plan of care cert/re-cert  Balance problem - Plan: PT plan of care cert/re-cert  Cramp and spasm - Plan: PT plan of care cert/re-cert     Problem List Patient Active Problem List   Diagnosis Date Noted   Vitamin D deficiency 05/15/2020    Class: Chronic   Closed displaced fracture of  right femoral neck (HCC) 05/14/2020   Hip fracture (HCC) 05/13/2020    Reather Laurence, PT, DPT 05/20/2021, 1:35 PM  [facility] [place] [facility] [address] [place], [place], [address] Phone: [phone]   Fax:  [phone]  Name: Richard Todd MRN: [id number] Date of Birth: 1946-04-04

## 2021-05-27 ENCOUNTER — Ambulatory Visit: Payer: Medicare Other | Admitting: Physical Therapy

## 2021-05-27 ENCOUNTER — Other Ambulatory Visit: Payer: Self-pay

## 2021-05-27 ENCOUNTER — Encounter: Payer: Self-pay | Admitting: Physical Therapy

## 2021-05-27 DIAGNOSIS — M546 Pain in thoracic spine: Secondary | ICD-10-CM

## 2021-05-27 DIAGNOSIS — R262 Difficulty in walking, not elsewhere classified: Secondary | ICD-10-CM

## 2021-05-27 DIAGNOSIS — M25551 Pain in right hip: Secondary | ICD-10-CM

## 2021-05-27 DIAGNOSIS — R2689 Other abnormalities of gait and mobility: Secondary | ICD-10-CM

## 2021-05-27 DIAGNOSIS — M6281 Muscle weakness (generalized): Secondary | ICD-10-CM

## 2021-05-27 NOTE — Therapy (Signed)
[facility] [place] [facility] [address] [place], [place], [address] Phone: [phone]   Fax:  [phone]  Physical Therapy Treatment  Patient Details  Name: Richard Todd MRN: [id number] Date of Birth: 1945/11/17 Referring Provider (PT): Dr Vira Browns   Encounter Date: 05/27/2021   PT End of Session - 05/27/21 1126     Visit Number 2    Date for PT Re-Evaluation 07/11/21    PT Start Time 1045    PT Stop Time 1127    PT Time Calculation (min) 42 min    Activity Tolerance Patient tolerated treatment well    Behavior During Therapy [facility] for tasks assessed/performed             Past Medical History:  Diagnosis Date   Fractures    Scoliosis     Past Surgical History:  Procedure Laterality Date   FOOT SURGERY     HIP PINNING,CANNULATED Right 05/14/2020   Procedure: CANNULATED HIP PINNING;  Surgeon: Kerrin Champagne, MD;  Location: [facility];  Service: Orthopedics;  Laterality: Right;    There were no vitals filed for this visit.   Subjective Assessment - 05/27/21 1045     Subjective Feels like everything is healing, but he can tell that his hip is weak at times    Currently in Pain? No/denies                               [facility] Adult PT Treatment/Exercise - 05/27/21 0001       Exercises   Exercises Knee/Hip      Knee/Hip Exercises: Aerobic   Nustep L5 x 6 min      Knee/Hip Exercises: Machines for Strengthening   Cybex Leg Press 40lb x10 RLE 20lb 2x5      Knee/Hip Exercises: Standing   Forward Step Up Both;2 sets;5 reps;Hand Hold: 0;Step Height: 4"    Walking with Sports Cord 30lb 4 way x 3 each      Knee/Hip Exercises: Seated   Ball Squeeze 2x10    Abduction/Adduction  Strengthening;Both;2 sets;10 reps    Abd/Adduction Limitations manual resistance    Sit to Sand 2 sets;10 reps;without UE support                      PT Short Term Goals - 05/27/21 1128       PT SHORT TERM GOAL #1    Title Pt will be independent with initial HEP.    Status Achieved               PT Long Term Goals - 05/20/21 1328       PT LONG TERM GOAL #1   Title Pt will be independent with advanced HEP.    Time 8    Period Weeks    Status New      PT LONG TERM GOAL #2   Title Pt will increase R hip strength to at least 4+/5 to allow him to negotiate steps with a reciprocal step pattern.    Time 8    Period Weeks    Status New      PT LONG TERM GOAL #3   Title Patient will increase BERG to at least 50/56 to place him at a lower risk of falling.    Time 8    Period Weeks    Status New      PT LONG  TERM GOAL #4   Title Patient will demonstrate good understanding of posture and body mechanics.    Time 8    Period Weeks    Status New                   Plan - 05/27/21 1128     Clinical Impression Statement Pt did well  today with a introduction to TE. Some compensation noted with sit to stands favoring his LLE. Some assist needed when stepping up with RLE. Some instability noted with resisted side steps. Weakness with SL on leg press. Hip fatigue with seated ball squeezes and resisted hip abduction. All interventions completed well with good effort.    Personal Factors and Comorbidities Comorbidity 2    Comorbidities Scoliosis, leg length discrepancy    Examination-Activity Limitations Stairs;Locomotion Level    Examination-Participation Restrictions Yard Work    Stability/Clinical Decision Making Evolving/Moderate complexity    Rehab Potential Good    PT Frequency 2x / week    PT Treatment/Interventions ADLs/Self Care Home Management;Cryotherapy;Electrical Stimulation;Iontophoresis 4mg /ml Dexamethasone;Moist Heat;Traction;Ultrasound;Gait training;Stair training;Functional mobility training;Therapeutic activities;Therapeutic exercise;Balance training;Neuromuscular re-education;Patient/family education;Manual techniques;Passive range of motion;Dry  needling;Taping;Vasopneumatic Device;Spinal Manipulations;Joint Manipulations    PT Next Visit Plan strengthening, core stability, balance, flexibility             Patient will benefit from skilled therapeutic intervention in order to improve the following deficits and impairments:  Decreased balance, Difficulty walking, Increased muscle spasms, Decreased strength, Impaired flexibility, Pain, Postural dysfunction  Visit Diagnosis: Pain in thoracic spine  Pain in right hip  Difficulty in walking, not elsewhere classified  Balance problem  Muscle weakness (generalized)     Problem List Patient Active Problem List   Diagnosis Date Noted   Vitamin D deficiency 05/15/2020    Class: Chronic   Closed displaced fracture of right femoral neck (HCC) 05/14/2020   Hip fracture (HCC) 05/13/2020    07/13/2020 05/27/2021, 11:35 AM  [facility] [place] [facility] [address] [place], [place], [place] Phone: [phone]   Fax:  [phone]  Name: Richard Todd MRN: Inis Sizer Date of Birth: 1946-05-07

## 2021-05-30 ENCOUNTER — Other Ambulatory Visit: Payer: Self-pay

## 2021-05-30 ENCOUNTER — Ambulatory Visit: Payer: Medicare Other | Admitting: Rehabilitative and Restorative Service Providers"

## 2021-05-30 ENCOUNTER — Encounter: Payer: Self-pay | Admitting: Rehabilitative and Restorative Service Providers"

## 2021-05-30 DIAGNOSIS — M6281 Muscle weakness (generalized): Secondary | ICD-10-CM

## 2021-05-30 DIAGNOSIS — M546 Pain in thoracic spine: Secondary | ICD-10-CM

## 2021-05-30 DIAGNOSIS — R2689 Other abnormalities of gait and mobility: Secondary | ICD-10-CM

## 2021-05-30 DIAGNOSIS — M25551 Pain in right hip: Secondary | ICD-10-CM

## 2021-05-30 DIAGNOSIS — R262 Difficulty in walking, not elsewhere classified: Secondary | ICD-10-CM

## 2021-05-30 NOTE — Therapy (Signed)
[facility] [place] [facility] [address] [place], [place], [address] Phone: [phone]   Fax:  [phone]  Physical Therapy Treatment  Patient Details  Name: Richard Todd MRN: [id number] Date of Birth: 1946/01/28 Referring Provider (PT): Dr Vira Browns   Encounter Date: 05/30/2021   PT End of Session - 05/30/21 1053     Visit Number 3    Date for PT Re-Evaluation 07/11/21    PT Start Time 1045    PT Stop Time 1125    PT Time Calculation (min) 40 min    Activity Tolerance Patient tolerated treatment well    Behavior During Therapy [facility] for tasks assessed/performed             Past Medical History:  Diagnosis Date   Fractures    Scoliosis     Past Surgical History:  Procedure Laterality Date   FOOT SURGERY     HIP PINNING,CANNULATED Right 05/14/2020   Procedure: CANNULATED HIP PINNING;  Surgeon: Kerrin Champagne, MD;  Location: [facility];  Service: Orthopedics;  Laterality: Right;    There were no vitals filed for this visit.   Subjective Assessment - 05/30/21 1051     Subjective I was sweating after last session.    Patient Stated Goals Pt wants to be able to continue pain free active lifestyle.    Currently in Pain? No/denies                               [facility] Adult PT Treatment/Exercise - 05/30/21 0001       Knee/Hip Exercises: Stretches   Gastroc Stretch Both;2 reps;20 seconds      Knee/Hip Exercises: Aerobic   Nustep L5 x 5 min    Other Aerobic UBE L2.0 x69min each way      Knee/Hip Exercises: Machines for Strengthening   Cybex Knee Extension 15# 2x10    Cybex Knee Flexion 25# 2x10    Cybex Leg Press 40lb x10 RLE 20lb 2x5      Knee/Hip Exercises: Standing   Heel Raises Both;2 sets;10 reps    Forward Step Up Both;2 sets;5 reps;Hand Hold: 0;Step Height: 4"    Walking with Sports Cord 30lb 4 way x 3 each                      PT Short Term Goals - 05/27/21 1128       PT SHORT  TERM GOAL #1   Title Pt will be independent with initial HEP.    Status Achieved               PT Long Term Goals - 05/30/21 1142       PT LONG TERM GOAL #1   Title Pt will be independent with advanced HEP.    Status On-going      PT LONG TERM GOAL #2   Title Pt will increase R hip strength to at least 4+/5 to allow him to negotiate steps with a reciprocal step pattern.    Status On-going      PT LONG TERM GOAL #3   Title Patient will increase BERG to at least 50/56 to place him at a lower risk of falling.    Status On-going      PT LONG TERM GOAL #4   Title Patient will demonstrate good understanding of posture and body mechanics.    Status On-going  Plan - 05/30/21 1137     Clinical Impression Statement Mr Wassink tolerated advancement of TE well and without any complaints of increased pain.  He continues to have compensations noted with sit to/from stand with leaning more towards LLE.  SL leg press continues to be most difficult and pt adducts hip to perform for increased stability. He reports that he can tell that he is getting stronger.  He continues to require skilled PT to progress towards goal related activiites.    PT Treatment/Interventions ADLs/Self Care Home Management;Cryotherapy;Electrical Stimulation;Iontophoresis 4mg /ml Dexamethasone;Moist Heat;Traction;Ultrasound;Gait training;Stair training;Functional mobility training;Therapeutic activities;Therapeutic exercise;Balance training;Neuromuscular re-education;Patient/family education;Manual techniques;Passive range of motion;Dry needling;Taping;Vasopneumatic Device;Spinal Manipulations;Joint Manipulations    PT Next Visit Plan strengthening, core stability, balance, flexibility    Consulted and Agree with Plan of Care Patient             Patient will benefit from skilled therapeutic intervention in order to improve the following deficits and impairments:  Decreased balance,  Difficulty walking, Increased muscle spasms, Decreased strength, Impaired flexibility, Pain, Postural dysfunction  Visit Diagnosis: Pain in thoracic spine  Pain in right hip  Difficulty in walking, not elsewhere classified  Balance problem  Muscle weakness (generalized)     Problem List Patient Active Problem List   Diagnosis Date Noted   Vitamin D deficiency 05/15/2020    Class: Chronic   Closed displaced fracture of right femoral neck (HCC) 05/14/2020   Hip fracture (HCC) 05/13/2020    07/13/2020, PT, DPT 05/30/2021, 11:44 AM  [facility] [place] [facility] [address] [place], [place], [place] Phone: [phone]   Fax:  [phone]  Name: Richard Todd MRN: Inis Sizer Date of Birth: 1946/03/01

## 2021-06-03 ENCOUNTER — Ambulatory Visit: Payer: Medicare Other | Admitting: Rehabilitative and Restorative Service Providers"

## 2021-06-03 ENCOUNTER — Other Ambulatory Visit: Payer: Self-pay

## 2021-06-03 ENCOUNTER — Encounter: Payer: Self-pay | Admitting: Rehabilitative and Restorative Service Providers"

## 2021-06-03 DIAGNOSIS — R262 Difficulty in walking, not elsewhere classified: Secondary | ICD-10-CM

## 2021-06-03 DIAGNOSIS — M546 Pain in thoracic spine: Secondary | ICD-10-CM

## 2021-06-03 DIAGNOSIS — R2689 Other abnormalities of gait and mobility: Secondary | ICD-10-CM

## 2021-06-03 DIAGNOSIS — M6281 Muscle weakness (generalized): Secondary | ICD-10-CM

## 2021-06-03 DIAGNOSIS — M25551 Pain in right hip: Secondary | ICD-10-CM

## 2021-06-03 NOTE — Therapy (Signed)
[facility] [place] [facility] [address] [place], [place], [address] Phone: [phone]   Fax:  [phone]  Physical Therapy Treatment  Patient Details  Name: Richard Todd MRN: [id number] Date of Birth: [birth year] Referring Provider (PT): Dr Vira Browns   Encounter Date: 06/03/2021   PT End of Session - 06/03/21 1051     Visit Number 4    Date for PT Re-Evaluation 07/11/21    PT Start Time 1047    PT Stop Time 1130    PT Time Calculation (min) 43 min    Activity Tolerance Patient tolerated treatment well    Behavior During Therapy [facility] for tasks assessed/performed             Past Medical History:  Diagnosis Date   Fractures    Scoliosis     Past Surgical History:  Procedure Laterality Date   FOOT SURGERY     HIP PINNING,CANNULATED Right 05/14/2020   Procedure: CANNULATED HIP PINNING;  Surgeon: Richard Champagne, MD;  Location: [facility];  Service: Orthopedics;  Laterality: Right;    There were no vitals filed for this visit.   Subjective Assessment - 06/03/21 1050     Subjective I was able to do some work in the yard this weekend and my back did not hurt after.    Patient Stated Goals Pt wants to be able to continue pain free active lifestyle.    Currently in Pain? No/denies                               [facility] Adult PT Treatment/Exercise - 06/03/21 0001       Knee/Hip Exercises: Aerobic   Nustep L5 x 5 min    Other Aerobic UBE L2.0 x69min each way      Knee/Hip Exercises: Machines for Strengthening   Cybex Knee Extension 15# 2x10    Cybex Knee Flexion 35# 2x10, RLE only 15# x10    Cybex Leg Press 40 lbs, 3 way feet x10 each    Other Machine Shoulder ext on tower B 10# 2x10      Knee/Hip Exercises: Standing   Forward Step Up Both;2 sets;5 reps;Hand Hold: 0;Step Height: 4"    Walking with Sports Cord 30lb 4 way x 3 each                      PT Short Term Goals - 05/27/21 1128       PT  SHORT TERM GOAL #1   Title Pt will be independent with initial HEP.    Status Achieved               PT Long Term Goals - 05/30/21 1142       PT LONG TERM GOAL #1   Title Pt will be independent with advanced HEP.    Status On-going      PT LONG TERM GOAL #2   Title Pt will increase R hip strength to at least 4+/5 to allow him to negotiate steps with a reciprocal step pattern.    Status On-going      PT LONG TERM GOAL #3   Title Patient will increase BERG to at least 50/56 to place him at a lower risk of falling.    Status On-going      PT LONG TERM GOAL #4   Title Patient will demonstrate good understanding of posture and body  mechanics.    Status On-going                   Plan - 06/03/21 1124     Clinical Impression Statement [name] tolerated TE well.  He is having increased balance with step up exercise.  He is progressing with his LE strength and states that he is no longer having rib pain from his recent rib fx from fal.  He continues to have R hip weakness secondary to his recent R hip ORIF.  He continues to require skilled PT to progress towards goal related activities.    PT Treatment/Interventions ADLs/Self Care Home Management;Cryotherapy;Electrical Stimulation;Iontophoresis 4mg /ml Dexamethasone;Moist Heat;Traction;Ultrasound;Gait training;Stair training;Functional mobility training;Therapeutic activities;Therapeutic exercise;Balance training;Neuromuscular re-education;Patient/family education;Manual techniques;Passive range of motion;Dry needling;Taping;Vasopneumatic Device;Spinal Manipulations;Joint Manipulations    PT Next Visit Plan strengthening, core stability, balance, flexibility    Consulted and Agree with Plan of Care Patient             Patient will benefit from skilled therapeutic intervention in order to improve the following deficits and impairments:  Decreased balance, Difficulty walking, Increased muscle spasms, Decreased strength,  Impaired flexibility, Pain, Postural dysfunction  Visit Diagnosis: Muscle weakness (generalized)  Balance problem  Difficulty in walking, not elsewhere classified  Pain in right hip  Pain in thoracic spine     Problem List Patient Active Problem List   Diagnosis Date Noted   Vitamin D deficiency 05/15/2020    Class: Chronic   Closed displaced fracture of right femoral neck (HCC) 05/14/2020   Hip fracture (HCC) 05/13/2020    07/13/2020, PT, DPT 06/03/2021, 11:33 AM  [facility] [place] [facility] [address] [place], [place], [place] Phone: [phone]   Fax:  [phone]  Name: Richard Todd MRN: Inis Sizer Date of Birth: 1946/03/18

## 2021-06-06 ENCOUNTER — Encounter: Payer: Self-pay | Admitting: Physical Therapy

## 2021-06-06 ENCOUNTER — Other Ambulatory Visit: Payer: Self-pay

## 2021-06-06 ENCOUNTER — Ambulatory Visit: Payer: Medicare Other | Attending: Specialist | Admitting: Physical Therapy

## 2021-06-06 DIAGNOSIS — R262 Difficulty in walking, not elsewhere classified: Secondary | ICD-10-CM | POA: Diagnosis present

## 2021-06-06 DIAGNOSIS — R252 Cramp and spasm: Secondary | ICD-10-CM | POA: Diagnosis present

## 2021-06-06 DIAGNOSIS — R2689 Other abnormalities of gait and mobility: Secondary | ICD-10-CM | POA: Diagnosis not present

## 2021-06-06 DIAGNOSIS — M546 Pain in thoracic spine: Secondary | ICD-10-CM | POA: Insufficient documentation

## 2021-06-06 DIAGNOSIS — M6281 Muscle weakness (generalized): Secondary | ICD-10-CM | POA: Insufficient documentation

## 2021-06-06 DIAGNOSIS — M25551 Pain in right hip: Secondary | ICD-10-CM | POA: Insufficient documentation

## 2021-06-06 NOTE — Therapy (Signed)
[place]. [place], [place], [address] Phone: [phone]   Fax:  [phone]  Physical Therapy Treatment  Patient Details  Name: Richard Todd MRN: [id number] Date of Birth: [birth year] Referring Provider (PT): Dr Basil Dess   Encounter Date: 06/06/2021   PT End of Session - 06/06/21 1132     Visit Number 5    Date for PT Re-Evaluation 07/11/21    PT Start Time 1050    PT Stop Time 1133    PT Time Calculation (min) 43 min    Activity Tolerance Patient tolerated treatment well    Behavior During Therapy [facility] for tasks assessed/performed             Past Medical History:  Diagnosis Date   Fractures    Scoliosis     Past Surgical History:  Procedure Laterality Date   FOOT SURGERY     HIP PINNING,CANNULATED Right 05/14/2020   Procedure: CANNULATED HIP PINNING;  Surgeon: Jessy Oto, MD;  Location: [facility];  Service: Orthopedics;  Laterality: Right;    There were no vitals filed for this visit.   Subjective Assessment - 06/06/21 1051     Subjective "I feel pretty good"    Currently in Pain? No/denies                               [facility] Adult PT Treatment/Exercise - 06/06/21 0001       Knee/Hip Exercises: Aerobic   Recumbent Bike L2 x 7 min      Knee/Hip Exercises: Machines for Strengthening   Cybex Knee Extension 15# x10, RLE 5lb 3x5    Cybex Knee Flexion 35# x10, RLE only 15# x10    Cybex Leg Press 50lb x10 RLE 20lb 2x5    Other Machine Shoulder ext on tower B 10# 2x10      Knee/Hip Exercises: Standing   Lateral Step Up Both;1 set;10 reps;Hand Hold: 1;Step Height: 4"    Forward Step Up Both;1 set;10 reps;Hand Hold: 0;Step Height: 4"      Knee/Hip Exercises: Seated   Sit to Sand 5 reps;without UE support;2 sets   on aires                     PT Short Term Goals - 06/06/21 1135       PT SHORT TERM GOAL #1   Title Pt will be independent with initial  HEP.    Status Achieved               PT Long Term Goals - 06/06/21 1136       PT LONG TERM GOAL #1   Title Pt will be independent with advanced HEP.    Status Partially Met      PT LONG TERM GOAL #2   Title Pt will increase R hip strength to at least 4+/5 to allow him to negotiate steps with a reciprocal step pattern.      PT LONG TERM GOAL #3   Title Patient will increase BERG to at least 50/56 to place him at a lower risk of falling.    Status On-going                   Plan - 06/06/21 1136     Clinical Impression Statement Pt continues to do well progressing with his LE strength. Increase resistance and or reps tolerated  on all machine level interventions. R knee valgus stress noted during the eccentric phase of step ups. RLE weakness present with single leg on leg press. Pt did report some R knee pain with SL leg extensions. Cue needed for sequencing with sit to stands not allowing LE to push against table.    Personal Factors and Comorbidities Comorbidity 2    Comorbidities Scoliosis, leg length discrepancy    Examination-Activity Limitations Stairs;Locomotion Level    Examination-Participation Restrictions Yard Work    Stability/Clinical Decision Making Evolving/Moderate complexity    Rehab Potential Good    PT Frequency 2x / week    PT Duration 8 weeks    PT Treatment/Interventions ADLs/Self Care Home Management;Cryotherapy;Electrical Stimulation;Iontophoresis 45m/ml Dexamethasone;Moist Heat;Traction;Ultrasound;Gait training;Stair training;Functional mobility training;Therapeutic activities;Therapeutic exercise;Balance training;Neuromuscular re-education;Patient/family education;Manual techniques;Passive range of motion;Dry needling;Taping;Vasopneumatic Device;Spinal Manipulations;Joint Manipulations    PT Next Visit Plan strengthening, core stability, balance, flexibility             Patient will benefit from skilled therapeutic intervention in order to  improve the following deficits and impairments:  Decreased balance, Difficulty walking, Increased muscle spasms, Decreased strength, Impaired flexibility, Pain, Postural dysfunction  Visit Diagnosis: Balance problem  Difficulty in walking, not elsewhere classified  Muscle weakness (generalized)  Pain in right hip     Problem List Patient Active Problem List   Diagnosis Date Noted   Vitamin D deficiency 05/15/2020    Class: Chronic   Closed displaced fracture of right femoral neck ([place] 05/14/2020   Hip fracture ([facility] 05/13/2020    RScot Jun9/11/2020, 11:46 AM  [place] [address]Phone: [phone]  Fax:  [phone] Name: Richard LakinsMRN: [id number] of Birth: 104-19-1947

## 2021-06-11 ENCOUNTER — Encounter: Payer: Self-pay | Admitting: Physical Therapy

## 2021-06-11 ENCOUNTER — Ambulatory Visit: Payer: Medicare Other | Admitting: Physical Therapy

## 2021-06-11 ENCOUNTER — Other Ambulatory Visit: Payer: Self-pay

## 2021-06-11 DIAGNOSIS — M6281 Muscle weakness (generalized): Secondary | ICD-10-CM

## 2021-06-11 DIAGNOSIS — M25551 Pain in right hip: Secondary | ICD-10-CM

## 2021-06-11 DIAGNOSIS — R2689 Other abnormalities of gait and mobility: Secondary | ICD-10-CM | POA: Diagnosis not present

## 2021-06-11 DIAGNOSIS — R262 Difficulty in walking, not elsewhere classified: Secondary | ICD-10-CM

## 2021-06-11 NOTE — Therapy (Signed)
[place]. [place], [place], [address] Phone: [phone]   Fax:  [phone]  Physical Therapy Treatment  Patient Details  Name: Richard Todd MRN: [id number] Date of Birth: [birth year] Referring Provider (PT): Dr Basil Dess   Encounter Date: 06/11/2021   PT End of Session - 06/11/21 1140     Visit Number 6    Date for PT Re-Evaluation 07/11/21    PT Start Time 1100    PT Stop Time 1142    PT Time Calculation (min) 42 min    Activity Tolerance Patient tolerated treatment well    Behavior During Therapy [facility] for tasks assessed/performed             Past Medical History:  Diagnosis Date   Fractures    Scoliosis     Past Surgical History:  Procedure Laterality Date   FOOT SURGERY     HIP PINNING,CANNULATED Right 05/14/2020   Procedure: CANNULATED HIP PINNING;  Surgeon: Jessy Oto, MD;  [facility];  Service: Orthopedics;  Laterality: Right;    There were no vitals filed for this visit.   Subjective Assessment - 06/11/21 1101     Subjective feeling ok, but still has some clicking    Currently in Pain? No/denies                               [facility] Adult PT Treatment/Exercise - 06/11/21 0001       Knee/Hip Exercises: Aerobic   Nustep L5 x 6 min      Knee/Hip Exercises: Machines for Strengthening   Cybex Knee Extension RLE 5lb 3x5    Cybex Knee Flexion RLE only 15# x10    Cybex Leg Press 50lb x10 RLE 20lb x10 x5      Knee/Hip Exercises: Standing   Lateral Step Up Both;1 set;10 reps;Hand Hold: 1;Step Height: 4"    Forward Step Up Both;1 set;10 reps;Hand Hold: 0;Step Height: 6"    Walking with Sports Cord 40lb 4 way x 3 each                  Upper Extremity Functional Index Score :   /80     PT Short Term Goals - 06/06/21 1135       PT SHORT TERM GOAL #1   Title Pt will be independent with initial HEP.    Status Achieved               PT  Long Term Goals - 06/11/21 1107       PT LONG TERM GOAL #4   Title Patient will demonstrate good understanding of posture and body mechanics.    Status Partially Met                   Plan - 06/11/21 1141     Clinical Impression Statement Pt reports some R hip clicking that he attributes that to needing a screw removed. Pt able to progress to 6 inch forward step up requiring HHA x1 for balance. CGA needed with resisted gait due to some instability. Pt did good with all SL strengthening interventions. No reports of increase pain.    Personal Factors and Comorbidities Comorbidity 2    Examination-Activity Limitations Stairs;Locomotion Level    Examination-Participation Restrictions [place]    Stability/Clinical Decision Making Evolving/Moderate complexity    Rehab Potential Good    PT Frequency 2x /  week    PT Treatment/Interventions ADLs/Self Care Home Management;Cryotherapy;Electrical Stimulation;Iontophoresis 21m/ml Dexamethasone;Moist Heat;Traction;Ultrasound;Gait training;Stair training;Functional mobility training;Therapeutic activities;Therapeutic exercise;Balance training;Neuromuscular re-education;Patient/family education;Manual techniques;Passive range of motion;Dry needling;Taping;Vasopneumatic Device;Spinal Manipulations;Joint Manipulations    PT Next Visit Plan strengthening, core stability, balance, flexibility             Patient will benefit from skilled therapeutic intervention in order to improve the following deficits and impairments:  Decreased balance, Difficulty walking, Increased muscle spasms, Decreased strength, Impaired flexibility, Pain, Postural dysfunction  Visit Diagnosis: Difficulty in walking, not elsewhere classified  Balance problem  Pain in right hip  Muscle weakness (generalized)     Problem List Patient Active Problem List   Diagnosis Date Noted   Vitamin D deficiency 05/15/2020    Class: Chronic   Closed displaced fracture of  right femoral neck ([place] 05/14/2020   Hip fracture ([facility] [place] 05/13/2020    RScot Jun PTA 06/11/2021, 11:44 AM  [place] [address]Phone: [phone]  Fax:  [phone] Name: Richard GaudinMRN: [id number] of Birth: 103/19/1947

## 2021-06-13 ENCOUNTER — Ambulatory Visit: Payer: Medicare Other | Admitting: Physical Therapy

## 2021-06-13 ENCOUNTER — Other Ambulatory Visit: Payer: Self-pay

## 2021-06-13 ENCOUNTER — Encounter: Payer: Self-pay | Admitting: Physical Therapy

## 2021-06-13 DIAGNOSIS — R2689 Other abnormalities of gait and mobility: Secondary | ICD-10-CM

## 2021-06-13 DIAGNOSIS — M25551 Pain in right hip: Secondary | ICD-10-CM

## 2021-06-13 DIAGNOSIS — M6281 Muscle weakness (generalized): Secondary | ICD-10-CM

## 2021-06-13 DIAGNOSIS — R252 Cramp and spasm: Secondary | ICD-10-CM

## 2021-06-13 DIAGNOSIS — M546 Pain in thoracic spine: Secondary | ICD-10-CM

## 2021-06-13 DIAGNOSIS — R262 Difficulty in walking, not elsewhere classified: Secondary | ICD-10-CM

## 2021-06-13 NOTE — Therapy (Signed)
[place]. [place], [place], [address] Phone: [phone]   Fax:  [phone]  Physical Therapy Treatment  Patient Details  Name: Richard Todd MRN: [id number] Date of Birth: [birth year] Referring Provider (PT): Dr Basil Dess   Encounter Date: 06/13/2021   PT End of Session - 06/13/21 1135     Visit Number 7    Date for PT Re-Evaluation 07/11/21    PT Start Time 1050    PT Stop Time 1133    PT Time Calculation (min) 43 min    Activity Tolerance Patient tolerated treatment well    Behavior During Therapy [facility] for tasks assessed/performed             Past Medical History:  Diagnosis Date   Fractures    Scoliosis     Past Surgical History:  Procedure Laterality Date   FOOT SURGERY     HIP PINNING,CANNULATED Right 05/14/2020   Procedure: CANNULATED HIP PINNING;  Surgeon: Jessy Oto, MD;  [facility];  Service: Orthopedics;  Laterality: Right;    There were no vitals filed for this visit.   Subjective Assessment - 06/13/21 1054     Subjective "good"    Currently in Pain? No/denies                               [facility] Adult PT Treatment/Exercise - 06/13/21 0001       Knee/Hip Exercises: Aerobic   Nustep L5 x 6 min      Knee/Hip Exercises: Machines for Strengthening   Cybex Knee Extension RLE 5lb 2x10    Cybex Knee Flexion RLE only 15# 2x10      Knee/Hip Exercises: Standing   Forward Step Up Both;1 set;10 reps;Hand Hold: 0;Step Height: 6"    Walking with Sports Cord 30lb Side step of foam roll x10 each      Knee/Hip Exercises: Seated   Sit to Sand 2 sets;10 reps;without UE support   LE on airex                      PT Short Term Goals - 06/06/21 1135       PT SHORT TERM GOAL #1   Title Pt will be independent with initial HEP.    Status Achieved               PT Long Term Goals - 06/11/21 1107       PT LONG TERM GOAL #4   Title Patient  will demonstrate good understanding of posture and body mechanics.    Status Partially Met                   Plan - 06/13/21 1136     Clinical Impression Statement Pt tolerated today's interventions well evident by no subjective reports of increase pain. Some instability noted with resisted side step over foam roll. Increase reps tolerated with both single leg curls and extensions. Cues needed not to allow LW to push up against mat table with sit to stands.    Personal Factors and Comorbidities Comorbidity 2    Comorbidities Scoliosis, leg length discrepancy    Examination-Activity Limitations Stairs;Locomotion Level    Examination-Participation Restrictions Yard Work    Stability/Clinical Decision Making Evolving/Moderate complexity    Rehab Potential Good    PT Frequency 2x / week    PT Duration 8 weeks  PT Treatment/Interventions ADLs/Self Care Home Management;Cryotherapy;Electrical Stimulation;Iontophoresis 95m/ml Dexamethasone;Moist Heat;Traction;Ultrasound;Gait training;Stair training;Functional mobility training;Therapeutic activities;Therapeutic exercise;Balance training;Neuromuscular re-education;Patient/family education;Manual techniques;Passive range of motion;Dry needling;Taping;Vasopneumatic Device;Spinal Manipulations;Joint Manipulations    PT Next Visit Plan strengthening, core stability, balance, flexibility             Patient will benefit from skilled therapeutic intervention in order to improve the following deficits and impairments:  Decreased balance, Difficulty walking, Increased muscle spasms, Decreased strength, Impaired flexibility, Pain, Postural dysfunction  Visit Diagnosis: Difficulty in walking, not elsewhere classified  Balance problem  Pain in right hip  Muscle weakness (generalized)  Pain in thoracic spine  Cramp and spasm     Problem List Patient Active Problem List   Diagnosis Date Noted   Vitamin D deficiency 05/15/2020     Class: Chronic   Closed displaced fracture of right femoral neck ([facility] 05/14/2020   Hip fracture ([place] 05/13/2020    RScot Jun PTA 06/13/2021, 11:41 AM  [place] [address]Phone: [phone]  Fax:  [phone] Name: Richard StankovichMRN: [id number] of Birth: [birth year]

## 2021-06-17 ENCOUNTER — Ambulatory Visit: Payer: Medicare Other | Admitting: Physical Therapy

## 2021-06-17 ENCOUNTER — Encounter: Payer: Self-pay | Admitting: Physical Therapy

## 2021-06-17 ENCOUNTER — Other Ambulatory Visit: Payer: Self-pay

## 2021-06-17 DIAGNOSIS — R2689 Other abnormalities of gait and mobility: Secondary | ICD-10-CM | POA: Diagnosis not present

## 2021-06-17 DIAGNOSIS — R262 Difficulty in walking, not elsewhere classified: Secondary | ICD-10-CM

## 2021-06-17 DIAGNOSIS — M6281 Muscle weakness (generalized): Secondary | ICD-10-CM

## 2021-06-17 DIAGNOSIS — M25551 Pain in right hip: Secondary | ICD-10-CM

## 2021-06-17 NOTE — Therapy (Signed)
[facility] [address] [place], [place], [address] Phone: [phone]   Fax:  [phone]  Physical Therapy Treatment  Patient Details  Name: Richard Todd MRN: [id number] Date of Birth: 03/07/1946 Referring Provider (PT): Dr Vira Browns   Encounter Date: 06/17/2021   PT End of Session - 06/17/21 1111     Visit Number 8    Date for PT Re-Evaluation 07/11/21    PT Start Time 1051    PT Stop Time 1138    PT Time Calculation (min) 47 min    Activity Tolerance Patient tolerated treatment well    Behavior During Therapy [facility] for tasks assessed/performed             Past Medical History:  Diagnosis Date   Fractures    Scoliosis     Past Surgical History:  Procedure Laterality Date   FOOT SURGERY     HIP PINNING,CANNULATED Right 05/14/2020   Procedure: CANNULATED HIP PINNING;  Surgeon: Kerrin Champagne, MD;  [facility];  Service: Orthopedics;  Laterality: Right;    There were no vitals filed for this visit.   Subjective Assessment - 06/17/21 1053     Subjective "we are good"    Currently in Pain? No/denies                [facility] PT Assessment - 06/17/21 0001       Berg Balance Test   Sit to Stand Able to stand without using hands and stabilize independently    Standing Unsupported Able to stand safely 2 minutes    Sitting with Back Unsupported but Feet Supported on Floor or Stool Able to sit safely and securely 2 minutes    Stand to Sit Sits safely with minimal use of hands    Transfers Able to transfer safely, minor use of hands    Standing Unsupported with Eyes Closed Able to stand 10 seconds safely    Standing Unsupported with Feet Together Able to place feet together independently and stand 1 minute safely    From Standing, Reach Forward with Outstretched Arm Can reach forward >12 cm safely (5")    From Standing Position, Pick up Object from Floor Able to pick up shoe safely and easily    From  Standing Position, Turn to Look Behind Over each Shoulder Looks behind from both sides and weight shifts well    Turn 360 Degrees Able to turn 360 degrees safely in 4 seconds or less    Standing Unsupported, Alternately Place Feet on Step/Stool Able to stand independently and safely and complete 8 steps in 20 seconds    Standing Unsupported, One Foot in Front Able to plae foot ahead of the other independently and hold 30 seconds    Standing on One Leg Able to lift leg independently and hold 5-10 seconds    Total Score 53                           [facility] Adult PT Treatment/Exercise - 06/17/21 0001       Knee/Hip Exercises: Aerobic   Recumbent Bike L3 x 7 min      Knee/Hip Exercises: Machines for Strengthening   Cybex Knee Extension 15lb 2x10, RLE 5lb 2x10    Cybex Knee Flexion 35lb 2x10,RLE only 15# 2x10    Other Machine Shoulder ext on tower B 10# 2x10      Knee/Hip Exercises: Standing  Lateral Step Up Both;1 set;10 reps;Hand Hold: 1;Step Height: 6"    Walking with Sports Cord 30lb Side step of foam roll x10 each                       PT Short Term Goals - 06/06/21 1135       PT SHORT TERM GOAL #1   Title Pt will be independent with initial HEP.    Status Achieved               PT Long Term Goals - 06/17/21 1112       PT LONG TERM GOAL #3   Title Patient will increase BERG to at least 50/56 to place him at a lower risk of falling.    Status Achieved                   Plan - 06/17/21 1138     Clinical Impression Statement Pt ha progressed increasing his BERG balance score meeting goal. No reports of pain during today's session. Instability remains with resisted side steps over half foam rolls. some LE fatigue reported with seated hamstring curls and leg extensions. Tactile cue for posture needed with shoulder extensions.    Personal Factors and Comorbidities Comorbidity 2    Comorbidities Scoliosis, leg length discrepancy     Examination-Activity Limitations Stairs;Locomotion Level    Examination-Participation Restrictions Yard Work    Stability/Clinical Decision Making Evolving/Moderate complexity    Rehab Potential Good    PT Frequency 2x / week    PT Duration 8 weeks    PT Treatment/Interventions ADLs/Self Care Home Management;Cryotherapy;Electrical Stimulation;Iontophoresis 4mg /ml Dexamethasone;Moist Heat;Traction;Ultrasound;Gait training;Stair training;Functional mobility training;Therapeutic activities;Therapeutic exercise;Balance training;Neuromuscular re-education;Patient/family education;Manual techniques;Passive range of motion;Dry needling;Taping;Vasopneumatic Device;Spinal Manipulations;Joint Manipulations    PT Next Visit Plan strengthening, core stability, balance, flexibility             Patient will benefit from skilled therapeutic intervention in order to improve the following deficits and impairments:  Decreased balance, Difficulty walking, Increased muscle spasms, Decreased strength, Impaired flexibility, Pain, Postural dysfunction  Visit Diagnosis: Difficulty in walking, not elsewhere classified  Balance problem  Pain in right hip  Muscle weakness (generalized)     Problem List Patient Active Problem List   Diagnosis Date Noted   Vitamin D deficiency 05/15/2020    Class: Chronic   Closed displaced fracture of right femoral neck (HCC) 05/14/2020   Hip fracture (HCC) 05/13/2020    07/13/2020, PTA 06/17/2021, 11:40 AM  [facility] [place] [address] [place], [place], [place] Phone: [phone]   Fax:  [phone]  Name: Richard Todd MRN: Inis Sizer Date of Birth: 1946/03/13

## 2021-06-19 ENCOUNTER — Ambulatory Visit (INDEPENDENT_AMBULATORY_CARE_PROVIDER_SITE_OTHER): Payer: Medicare Other | Admitting: Specialist

## 2021-06-19 ENCOUNTER — Other Ambulatory Visit: Payer: Self-pay

## 2021-06-19 ENCOUNTER — Encounter: Payer: Self-pay | Admitting: Specialist

## 2021-06-19 VITALS — BP 137/74 | HR 65 | Ht 72.0 in | Wt 180.0 lb

## 2021-06-19 DIAGNOSIS — M7061 Trochanteric bursitis, right hip: Secondary | ICD-10-CM

## 2021-06-19 DIAGNOSIS — R29898 Other symptoms and signs involving the musculoskeletal system: Secondary | ICD-10-CM

## 2021-06-19 DIAGNOSIS — M2170 Unequal limb length (acquired), unspecified site: Secondary | ICD-10-CM

## 2021-06-19 NOTE — Progress Notes (Signed)
Office Visit Note   Patient: Richard Todd           Date of Birth: 1946-07-26           MRN: [id number] Visit Date: 06/19/2021              Requested by: No referring provider defined for this encounter. PCP: Pcp, No   Assessment & Plan: Visit Diagnoses:  1. Trochanteric bursitis, right hip   2. Leg length discrepancy   3. Weakness of right hip     Plan: Continue strengthening and steps for strengthening  Tylenol for discomfort.  Follow-Up Instructions: No follow-ups on file.   Orders:  No orders of the defined types were placed in this encounter.  No orders of the defined types were placed in this encounter.     Procedures: Large Joint Inj: R greater trochanter on 06/19/2021 11:44 AM Indications: pain Details: 25 G 3.5 in needle, lateral approach  Arthrogram: No  Medications: 40 mg methylPREDNISolone acetate 40 MG/ML; 4 mL bupivacaine 0.25 % Outcome: tolerated well, no immediate complications  Band aid applied Procedure, treatment alternatives, risks and benefits explained, specific risks discussed. Immediately prior to procedure a time out was called to verify the correct patient, procedure, equipment, support staff and site/side marked as required. Patient was prepped and draped in the usual sterile fashion.     Clinical Data: No additional findings.   Subjective: Chief Complaint  Patient presents with   Middle Back - Follow-up    Doing better no pain in that area   Right Hip - Follow-up    Still has catching in the right hip, doing Physical Therapy with it, can't lead with Right leg up stairs, does have good strength in the right leg    HPI 75 year old male with history of right hip pinning for impacted valgus femoral neck fracture. He is standing and walking well, had a second fall this summer with multiple rib fractures and Hemothorax that improved with conservative management. He went to bed for several weeks and we did an xray that showed the  healing ribs and the sign of a minimal hemothorax. This is much improved. His major complaint today is feelings of catching sensation in the area of the lateral right greater trochanter with flexion and extension. Pain with lying on the right side. No bowel or bladder difficulty. He has no leg numbness or paresthesias. He reports he is able to walk as tolerated but has some weakness going up and down stairs and leads with the unoperated left hip and leg.   Review of Systems   Objective: Vital Signs: BP 137/74 (BP Location: Left Arm, Patient Position: Sitting)   Pulse 65   Ht 6' (1.829 m)   Wt 180 lb (81.6 kg)   BMI 24.41 kg/m   Physical Exam  Ortho Exam  Specialty Comments:  No specialty comments available.  Imaging: No results found.   PMFS History: Patient Active Problem List   Diagnosis Date Noted   Vitamin D deficiency 05/15/2020    Priority: Medium    Class: Chronic   Closed displaced fracture of right femoral neck (HCC) 05/14/2020   Hip fracture (HCC) 05/13/2020   Past Medical History:  Diagnosis Date   Fractures    Scoliosis     Family History  Problem Relation Age of Onset   Cancer Father     Past Surgical History:  Procedure Laterality Date   FOOT SURGERY     HIP  PINNING,CANNULATED Right 05/14/2020   Procedure: CANNULATED HIP PINNING;  Surgeon: Kerrin Champagne, MD;  Location: [facility];  Service: Orthopedics;  Laterality: Right;   Social History   Occupational History   Occupation: Brewing technologist: Production assistant, radio FOR SELF EMPLOYED  Tobacco Use   Smoking status: Former    Packs/day: 1.00    Years: 10.00    Pack years: 10.00    Types: Cigarettes    Quit date: 05/14/1977    Years since quitting: 44.1   Smokeless tobacco: Never  Vaping Use   Vaping Use: Never used  Substance and Sexual Activity   Alcohol use: Yes    Comment: social   Drug use: Never   Sexual activity: Not on file

## 2021-06-19 NOTE — Patient Instructions (Signed)
Continue strengthening and steps for strengthening  Tylenol for discomfort.

## 2021-06-20 ENCOUNTER — Ambulatory Visit: Payer: Medicare Other | Admitting: Rehabilitative and Restorative Service Providers"

## 2021-06-20 ENCOUNTER — Encounter: Payer: Self-pay | Admitting: Rehabilitative and Restorative Service Providers"

## 2021-06-20 DIAGNOSIS — M6281 Muscle weakness (generalized): Secondary | ICD-10-CM

## 2021-06-20 DIAGNOSIS — R2689 Other abnormalities of gait and mobility: Secondary | ICD-10-CM

## 2021-06-20 DIAGNOSIS — R262 Difficulty in walking, not elsewhere classified: Secondary | ICD-10-CM

## 2021-06-20 DIAGNOSIS — M25551 Pain in right hip: Secondary | ICD-10-CM

## 2021-06-20 DIAGNOSIS — M546 Pain in thoracic spine: Secondary | ICD-10-CM

## 2021-06-20 DIAGNOSIS — R252 Cramp and spasm: Secondary | ICD-10-CM

## 2021-06-20 NOTE — Therapy (Signed)
[facility] [address] [place], [place], [address] Phone: [phone]   Fax:  [phone]  Physical Therapy Treatment  Patient Details  Name: Richard Todd MRN: [id number] Date of Birth: [birth year] Referring Provider (PT): Dr Vira Browns   Encounter Date: 06/20/2021   PT End of Session - 06/20/21 1035     Visit Number 9    Date for PT Re-Evaluation 07/11/21    PT Start Time 1030    PT Stop Time 1110    PT Time Calculation (min) 40 min    Activity Tolerance Patient tolerated treatment well    Behavior During Therapy [facility] for tasks assessed/performed             Past Medical History:  Diagnosis Date   Fractures    Scoliosis     Past Surgical History:  Procedure Laterality Date   FOOT SURGERY     HIP PINNING,CANNULATED Right 05/14/2020   Procedure: CANNULATED HIP PINNING;  Surgeon: Kerrin Champagne, MD;  Location: [facility];  Service: Orthopedics;  Laterality: Right;    There were no vitals filed for this visit.   Subjective Assessment - 06/20/21 1035     Subjective I got a steriod injection in my R hip yesterday.    Patient Stated Goals Pt wants to be able to continue pain free active lifestyle.    Currently in Pain? No/denies                               [facility] Adult PT Treatment/Exercise - 06/20/21 0001       High Level Balance   High Level Balance Activities Tandem walking      Knee/Hip Exercises: Aerobic   Nustep L5 x6 min LE only      Knee/Hip Exercises: Machines for Strengthening   Cybex Knee Extension 15lb 2x10, RLE 5lb 2x10    Cybex Knee Flexion 35lb 2x10,RLE only 15# 2x10    Cybex Leg Press 50lb x10 RLE 20lb 2x10    Other Machine Shoulder ext on tower B 10# 2x10      Knee/Hip Exercises: Standing   Lateral Step Up Both;1 set;10 reps;Hand Hold: 1;Step Height: 6"    Forward Step Up Both;1 set;10 reps;Hand Hold: 2;Other (comment)   on Bosu   Walking with Sports Cord 30lb Side  step of foam roll x10 each                       PT Short Term Goals - 06/06/21 1135       PT SHORT TERM GOAL #1   Title Pt will be independent with initial HEP.    Status Achieved               PT Long Term Goals - 06/17/21 1112       PT LONG TERM GOAL #3   Title Patient will increase BERG to at least 50/56 to place him at a lower risk of falling.    Status Achieved                   Plan - 06/20/21 1121     Clinical Impression Statement Mr Vitali continues to tolerate ther ex well.  He has most difficulty with RLE only strengthening exercises, such as RLE only leg press.  He continues to have decreased balance and required close supervision with resisted side steps over half  foam roll.  With step up on bosu, he required UE support secondary to instability.  Provided tactile cuing during shoulder extension exercise for improved posture and to decrease shoulder shrugging.    PT Treatment/Interventions ADLs/Self Care Home Management;Cryotherapy;Electrical Stimulation;Iontophoresis 4mg /ml Dexamethasone;Moist Heat;Traction;Ultrasound;Gait training;Stair training;Functional mobility training;Therapeutic activities;Therapeutic exercise;Balance training;Neuromuscular re-education;Patient/family education;Manual techniques;Passive range of motion;Dry needling;Taping;Vasopneumatic Device;Spinal Manipulations;Joint Manipulations    PT Next Visit Plan strengthening, core stability, balance, flexibility    Consulted and Agree with Plan of Care Patient             Patient will benefit from skilled therapeutic intervention in order to improve the following deficits and impairments:  Decreased balance, Difficulty walking, Increased muscle spasms, Decreased strength, Impaired flexibility, Pain, Postural dysfunction  Visit Diagnosis: Difficulty in walking, not elsewhere classified  Balance problem  Pain in right hip  Muscle weakness (generalized)  Pain in  thoracic spine  Cramp and spasm     Problem List Patient Active Problem List   Diagnosis Date Noted   Vitamin D deficiency 05/15/2020    Class: Chronic   Closed displaced fracture of right femoral neck (HCC) 05/14/2020   Hip fracture (HCC) 05/13/2020    07/13/2020, PT, DPT 06/20/2021, 11:25 AM  [facility] [place] [address] [place], [place], [place] Phone: [phone]   Fax:  [phone]  Name: Rommel Hogston MRN: Inis Sizer Date of Birth: [birth year]

## 2021-06-24 ENCOUNTER — Encounter: Payer: Self-pay | Admitting: Physical Therapy

## 2021-06-24 ENCOUNTER — Ambulatory Visit: Payer: Medicare Other | Admitting: Physical Therapy

## 2021-06-24 ENCOUNTER — Other Ambulatory Visit: Payer: Self-pay

## 2021-06-24 DIAGNOSIS — R2689 Other abnormalities of gait and mobility: Secondary | ICD-10-CM

## 2021-06-24 DIAGNOSIS — M6281 Muscle weakness (generalized): Secondary | ICD-10-CM

## 2021-06-24 DIAGNOSIS — M25551 Pain in right hip: Secondary | ICD-10-CM

## 2021-06-24 DIAGNOSIS — R262 Difficulty in walking, not elsewhere classified: Secondary | ICD-10-CM

## 2021-06-24 DIAGNOSIS — M546 Pain in thoracic spine: Secondary | ICD-10-CM

## 2021-06-24 DIAGNOSIS — R252 Cramp and spasm: Secondary | ICD-10-CM

## 2021-06-24 NOTE — Therapy (Signed)
[place] [place], [place], [address] Phone: [phone]   Fax:  [phone]  Physical Therapy Discharge Summary  Patient Details  Name: Richard Todd MRN: [id number] Date of Birth: [birth year] Referring Provider (PT): Dr Basil Dess   Encounter Date: 06/24/2021   PT End of Session - 06/24/21 1145     Visit Number 10    Date for PT Re-Evaluation 07/11/21    PT Start Time 1105    PT Stop Time 1143    PT Time Calculation (min) 38 min    Activity Tolerance Patient tolerated treatment well    Behavior During Therapy [facility] for tasks assessed/performed             Past Medical History:  Diagnosis Date   Fractures    Scoliosis     Past Surgical History:  Procedure Laterality Date   FOOT SURGERY     HIP PINNING,CANNULATED Right 05/14/2020   Procedure: CANNULATED HIP PINNING;  Surgeon: Jessy Oto, MD;  Location: [facility];  Service: Orthopedics;  Laterality: Right;    There were no vitals filed for this visit.   Subjective Assessment - 06/24/21 1107     Subjective Doing pretty good. No new issues or falls. Reports some remaining R hip pressure when walking uphill or going upstairs. However, was able to cut underbrush with a chainsaw recently.    Pertinent History R hip pinning 05/14/21, Recent falls    Diagnostic tests Radiographs: leg length discrepancy, negative for Fx    Patient Stated Goals Pt wants to be able to continue pain free active lifestyle.    Currently in Pain? No/denies                [facility] PT Assessment - 06/24/21 1110       Assessment   Medical Diagnosis Thoracic Pain, Rib Fx, R hip pain    Referring Provider (PT) Dr Basil Dess      Strength   Strength Assessment Site Hip;Knee;Ankle    Right/Left Hip Right;Left    Right Hip Flexion 4+/5    Right Hip External Rotation  4+/5    Right Hip Internal Rotation 4+/5    Right Hip ABduction 4/5    Right Hip ADduction 4+/5    Left  Hip Flexion 4+/5    Left Hip External Rotation 4+/5    Left Hip Internal Rotation 4+/5    Left Hip ABduction 4+/5    Left Hip ADduction 4+/5    Right/Left Knee Right;Left    Right Knee Flexion 4+/5    Right Knee Extension 5/5    Left Knee Flexion 4+/5    Left Knee Extension 5/5    Right/Left Ankle Right;Left    Right Ankle Dorsiflexion 4+/5    Right Ankle Plantar Flexion 4+/5    Left Ankle Dorsiflexion 4+/5    Left Ankle Plantar Flexion 4+/5                           [facility] Adult PT Treatment/Exercise - 06/24/21 1107       Knee/Hip Exercises: Stretches   Piriformis Stretch Right;2 reps;30 seconds    Piriformis Stretch Limitations better success in supine than sitting; KTOS and fig 4      Knee/Hip Exercises: Aerobic   Other Aerobic UBE L2.5 x69mn each way      Knee/Hip Exercises: Standing   Hip Abduction Stengthening;Right;Left;1 set;10 reps;Knee straight  Abduction Limitations red TB around ankles   L trunk lean     Knee/Hip Exercises: Supine   Bridges Strengthening;Both;1 set;10 reps    Other Supine Knee/Hip Exercises sidestepping with red loop around ankles 2x53f                     PT Education - 06/24/21 1145     Education Details update to HEP; discussion on objective progress and remaining impairments    Person(s) Educated Patient    Methods Explanation;Demonstration;Tactile cues;Verbal cues;Handout    Comprehension Returned demonstration;Verbalized understanding              PT Short Term Goals - 06/24/21 1109       PT SHORT TERM GOAL #1   Title Pt will be independent with initial HEP.    Status Achieved               PT Long Term Goals - 06/24/21 1151       PT LONG TERM GOAL #1   Title Pt will be independent with advanced HEP.    Status Achieved      PT LONG TERM GOAL #2   Title Pt will increase R hip strength to at least 4+/5 to allow him to negotiate steps with a reciprocal step pattern.    Status  Partially Met   R hip abd weakness     PT LONG TERM GOAL #3   Title Patient will increase BERG to at least 50/56 to place him at a lower risk of falling.    Status Achieved      PT LONG TERM GOAL #4   Title Patient will demonstrate good understanding of posture and body mechanics.    Status Achieved   able to correct posture according to cues given                  Plan - 06/24/21 1145     Clinical Impression Statement Patient without new complaints today. Reports remaining R hip pressure when walking uphill and ascending stairs, but otherwise does not report hip pain. Also reports having been able to work in the yard with a chainsaw without issues or imbalance. LE strength testing revealed good improvement; R hip abduction only remaining weakness. Patient has met all other goals at this time. Reviewed LE strengthening ther-ex to address remaining hip weakness- patient tolerated this well and reported good understanding. At this time patient has returned to ADLs and yard work and is pain free. Ready for DC at this time.    Comorbidities Scoliosis, leg length discrepancy    PT Treatment/Interventions ADLs/Self Care Home Management;Cryotherapy;Electrical Stimulation;Iontophoresis 456mml Dexamethasone;Moist Heat;Traction;Ultrasound;Gait training;Stair training;Functional mobility training;Therapeutic activities;Therapeutic exercise;Balance training;Neuromuscular re-education;Patient/family education;Manual techniques;Passive range of motion;Dry needling;Taping;Vasopneumatic Device;Spinal Manipulations;Joint Manipulations    PT Next Visit Plan strengthening, core stability, balance, flexibility    Consulted and Agree with Plan of Care Patient             Patient will benefit from skilled therapeutic intervention in order to improve the following deficits and impairments:  Decreased balance, Difficulty walking, Increased muscle spasms, Decreased strength, Impaired flexibility, Pain,  Postural dysfunction  Visit Diagnosis: Difficulty in walking, not elsewhere classified  Balance problem  Pain in right hip  Muscle weakness (generalized)  Pain in thoracic spine  Cramp and spasm     Problem List Patient Active Problem List   Diagnosis Date Noted   Vitamin D deficiency 05/15/2020    Class: Chronic  Closed displaced fracture of right femoral neck ([place]) 05/14/2020   Hip fracture ([place]) 05/13/2020    PHYSICAL THERAPY DISCHARGE SUMMARY  Visits from Start of Care: 10  Current functional level related to goals / functional outcomes: See above clinical impression   Remaining deficits: none   Education / Equipment: HEP  Plan: Patient agrees to discharge.  Patient goals were met. Patient is being discharged due to meeting the stated rehab goals.        Janene Harvey, PT, DPT 06/24/21 11:55 AM   [place]. [place], [place], [address] Phone: [phone]   Fax:  [phone]  Name: Richard Todd MRN: [id number] Date of Birth: 1946/05/04

## 2021-06-24 NOTE — Patient Instructions (Signed)
Access Code: [email or web address] URL: [email or web address] Date: 06/24/2021 Prepared by: Georgina Peer  Exercises Seated Figure 4 Piriformis Stretch - 2 x daily - 7 x weekly - 2 sets - 30 sec hold Seated Piriformis Stretch - 2 x daily - 7 x weekly - 2 sets - 30 sec hold Supine Bridge with Resistance Band - 2 x daily - 7 x weekly - 2 sets - 10 reps Side Stepping with Resistance at Ankles - 2 x daily - 7 x weekly - 2 sets - 10 reps Standing Hip Abduction with Resistance at Ankles and Counter Support - 2 x daily - 7 x weekly - 2 sets - 10 reps Tandem Walking with Counter Support - 2 x daily - 7 x weekly - 2 sets - 10 reps

## 2021-06-27 ENCOUNTER — Ambulatory Visit: Payer: Medicare Other | Admitting: Rehabilitative and Restorative Service Providers"

## 2021-07-01 ENCOUNTER — Ambulatory Visit: Payer: Medicare Other | Admitting: Rehabilitative and Restorative Service Providers"

## 2021-07-04 ENCOUNTER — Ambulatory Visit: Payer: Medicare Other | Admitting: Rehabilitative and Restorative Service Providers"

## 2021-09-09 DIAGNOSIS — H2513 Age-related nuclear cataract, bilateral: Secondary | ICD-10-CM | POA: Diagnosis not present

## 2021-09-09 DIAGNOSIS — H25043 Posterior subcapsular polar age-related cataract, bilateral: Secondary | ICD-10-CM | POA: Diagnosis not present

## 2021-09-09 DIAGNOSIS — H18413 Arcus senilis, bilateral: Secondary | ICD-10-CM | POA: Diagnosis not present

## 2021-09-09 DIAGNOSIS — H25013 Cortical age-related cataract, bilateral: Secondary | ICD-10-CM | POA: Diagnosis not present

## 2021-10-20 MED ORDER — METHYLPREDNISOLONE ACETATE 40 MG/ML IJ SUSP
40.0000 mg | INTRAMUSCULAR | Status: AC | PRN
  Administered 2021-06-19: 40 mg via INTRA_ARTICULAR

## 2021-10-20 MED ORDER — BUPIVACAINE HCL 0.25 % IJ SOLN
4.0000 mL | INTRAMUSCULAR | Status: AC | PRN
  Administered 2021-06-19: 4 mL via INTRA_ARTICULAR

## 2021-11-21 DIAGNOSIS — H2511 Age-related nuclear cataract, right eye: Secondary | ICD-10-CM | POA: Diagnosis not present

## 2021-11-21 DIAGNOSIS — H2513 Age-related nuclear cataract, bilateral: Secondary | ICD-10-CM | POA: Diagnosis not present

## 2021-11-21 DIAGNOSIS — H2512 Age-related nuclear cataract, left eye: Secondary | ICD-10-CM | POA: Diagnosis not present

## 2021-12-19 DIAGNOSIS — H2513 Age-related nuclear cataract, bilateral: Secondary | ICD-10-CM | POA: Diagnosis not present

## 2021-12-19 DIAGNOSIS — H2512 Age-related nuclear cataract, left eye: Secondary | ICD-10-CM | POA: Diagnosis not present

## 2022-01-02 IMAGING — DX DG HIP (WITH OR WITHOUT PELVIS) 2-3V*R*
3 series · 3 of 3 positions shown · non-contrast
Comparison: None.

CLINICAL DATA: Fall, right hip pain

EXAM:
DG HIP (WITH OR WITHOUT PELVIS) 2-3V RIGHT

[hip ap]
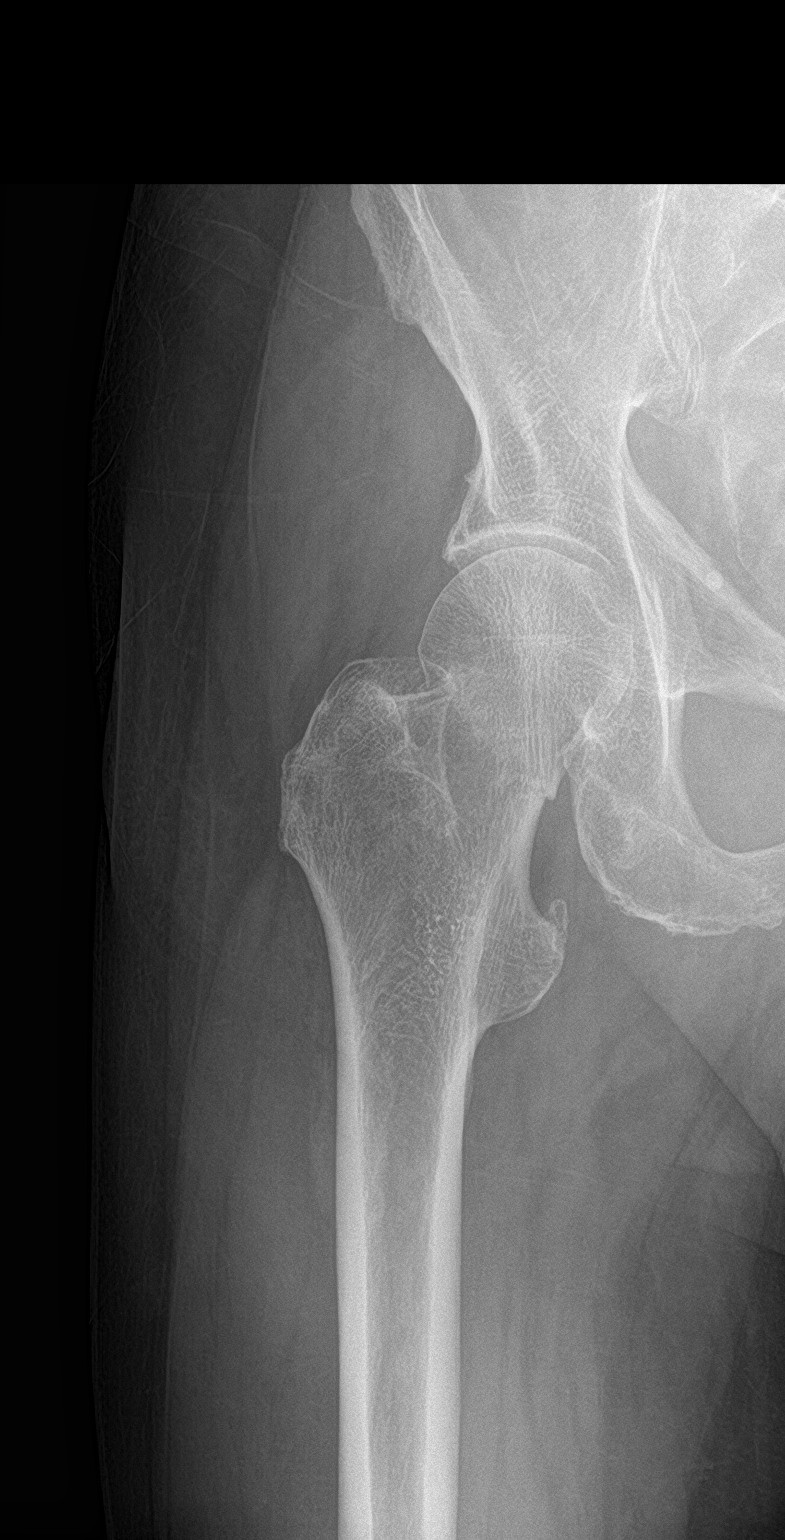

[hip lat]
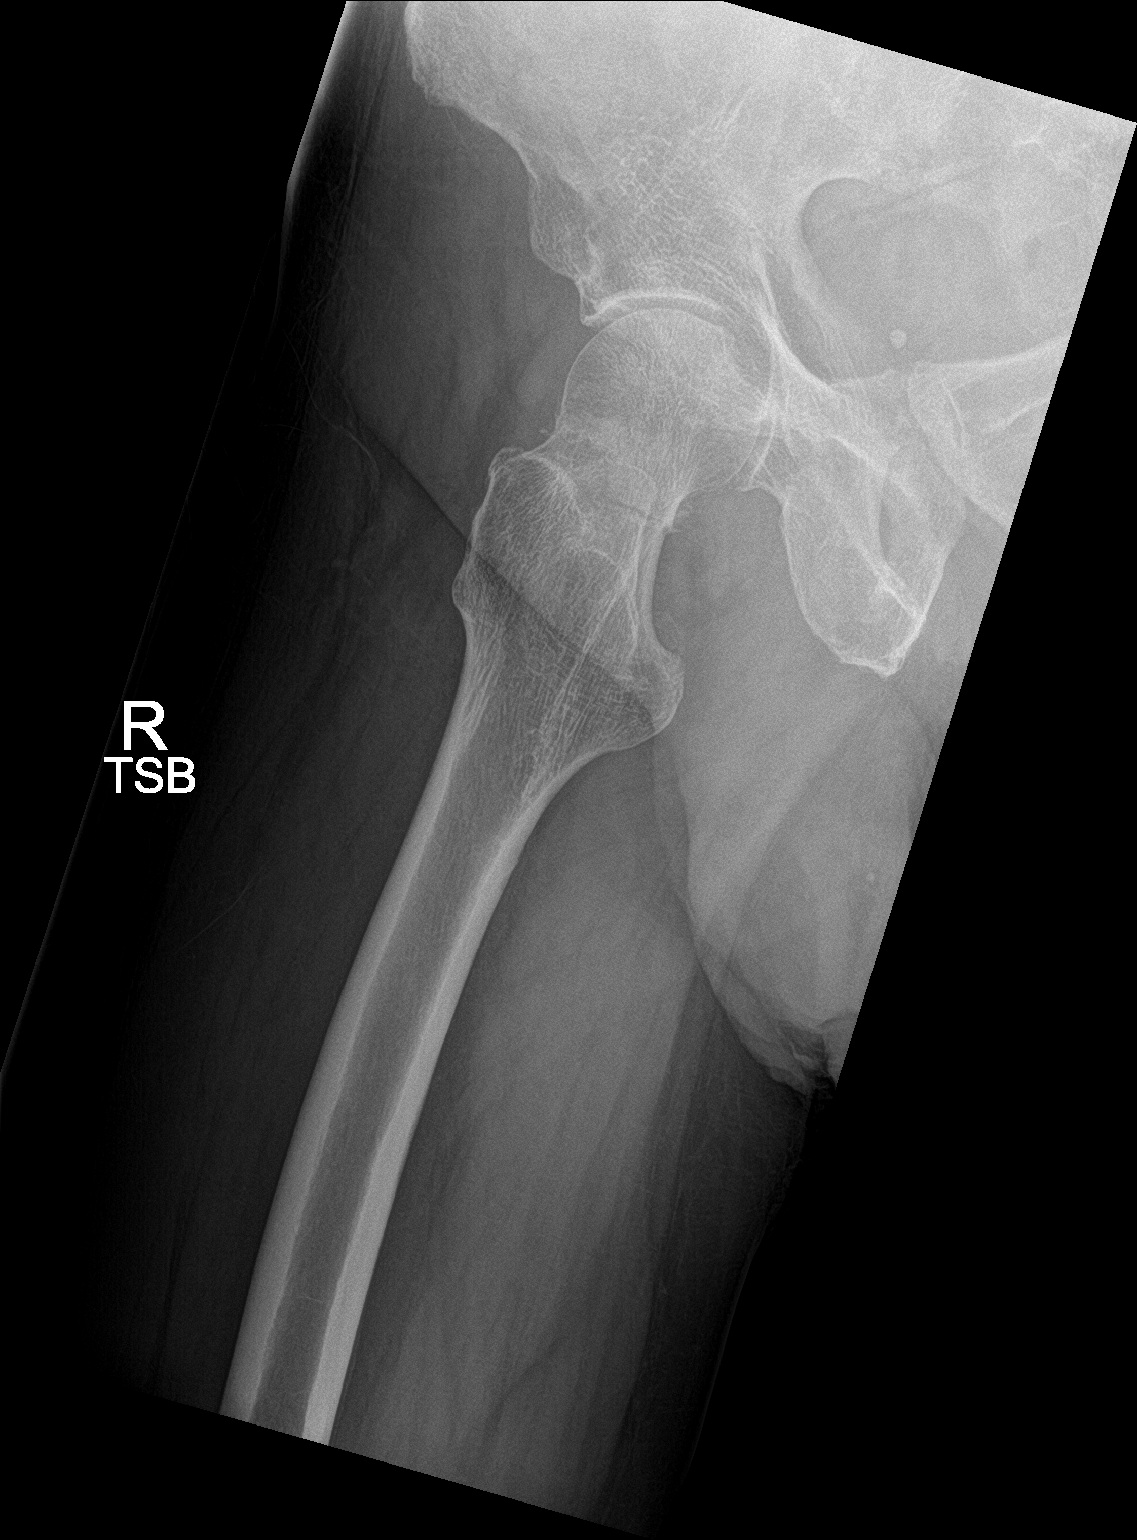

[pelvis ap]
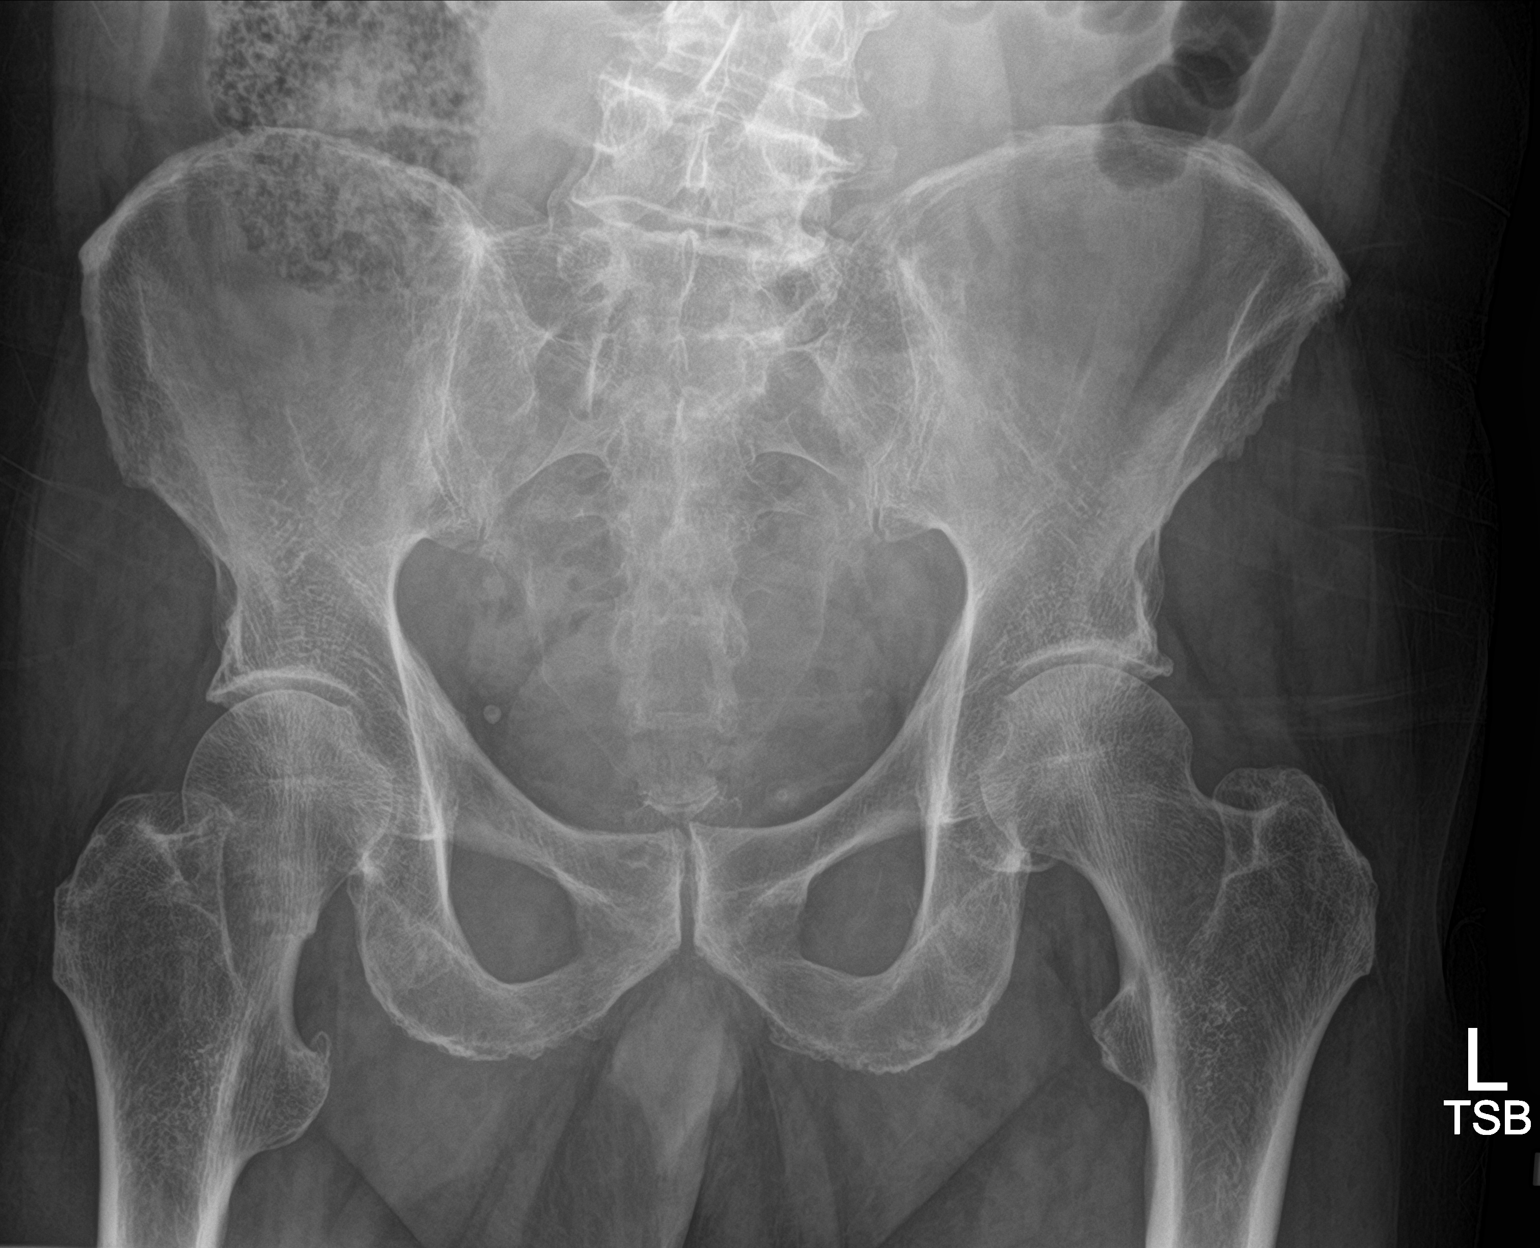

[3 of 3 positions shown; findings below may reference images not displayed]

FINDINGS: Single view radiograph of the pelvis and two view radiograph of the
right hip demonstrates an acute transcervical femoral neck fracture
with mild impaction and external rotation of the femoral head with
acetabulum. The femoral head remains seated within the acetabulum.
There is mild bilateral degenerative hip disease with joint space
pelvis and sacrum appear intact
IMPRESSION: Acute, impacted transcervical right femoral neck fracture. Femoral
head is seated within the acetabulum but demonstrates mild external
rotation.

## 2022-05-19 DIAGNOSIS — U071 COVID-19: Secondary | ICD-10-CM | POA: Diagnosis not present

## 2023-06-03 DIAGNOSIS — L821 Other seborrheic keratosis: Secondary | ICD-10-CM | POA: Diagnosis not present

## 2023-06-03 DIAGNOSIS — D485 Neoplasm of uncertain behavior of skin: Secondary | ICD-10-CM | POA: Diagnosis not present

## 2023-06-03 DIAGNOSIS — L918 Other hypertrophic disorders of the skin: Secondary | ICD-10-CM | POA: Diagnosis not present

## 2023-06-03 DIAGNOSIS — L814 Other melanin hyperpigmentation: Secondary | ICD-10-CM | POA: Diagnosis not present

## 2023-06-03 DIAGNOSIS — L565 Disseminated superficial actinic porokeratosis (DSAP): Secondary | ICD-10-CM | POA: Diagnosis not present

## 2023-09-08 DIAGNOSIS — R300 Dysuria: Secondary | ICD-10-CM | POA: Diagnosis not present

## 2023-09-08 DIAGNOSIS — N3000 Acute cystitis without hematuria: Secondary | ICD-10-CM | POA: Diagnosis not present

## 2023-10-21 DIAGNOSIS — J208 Acute bronchitis due to other specified organisms: Secondary | ICD-10-CM | POA: Diagnosis not present

## 2024-01-17 DIAGNOSIS — H6123 Impacted cerumen, bilateral: Secondary | ICD-10-CM | POA: Diagnosis not present

## 2024-06-09 DIAGNOSIS — H6122 Impacted cerumen, left ear: Secondary | ICD-10-CM | POA: Diagnosis not present

## 2024-06-09 DIAGNOSIS — H9192 Unspecified hearing loss, left ear: Secondary | ICD-10-CM | POA: Diagnosis not present
